# Patient Record
Sex: Female | Born: 1990
Health system: Southern US, Community
[De-identification: ages and names within clinical notes are randomized; demographics above are authoritative.]

## PROBLEM LIST (undated history)

## (undated) DIAGNOSIS — K601 Chronic anal fissure: Secondary | ICD-10-CM

## (undated) HISTORY — DX: Chronic anal fissure: K60.1

## (undated) HISTORY — PX: OTHER SURGICAL HISTORY: SHX169

## (undated) HISTORY — PX: WISDOM TOOTH EXTRACTION: SHX21

---

## 2014-02-19 HISTORY — PX: OTHER SURGICAL HISTORY: SHX169

## 2016-11-21 LAB — HM PAP SMEAR: HM Pap smear: NEGATIVE

## 2017-12-22 ENCOUNTER — Ambulatory Visit (INDEPENDENT_AMBULATORY_CARE_PROVIDER_SITE_OTHER): Payer: Self-pay | Admitting: Internal Medicine

## 2017-12-22 DIAGNOSIS — Z7189 Other specified counseling: Secondary | ICD-10-CM

## 2017-12-22 DIAGNOSIS — Z7184 Encounter for health counseling related to travel: Secondary | ICD-10-CM

## 2017-12-22 DIAGNOSIS — Z9189 Other specified personal risk factors, not elsewhere classified: Secondary | ICD-10-CM

## 2017-12-22 DIAGNOSIS — Z23 Encounter for immunization: Secondary | ICD-10-CM

## 2017-12-22 DIAGNOSIS — Z789 Other specified health status: Secondary | ICD-10-CM

## 2017-12-22 DIAGNOSIS — Z298 Encounter for other specified prophylactic measures: Secondary | ICD-10-CM

## 2017-12-22 MED ORDER — TYPHOID VACCINE PO CPDR: 1.0000 | DELAYED_RELEASE_CAPSULE | ORAL | 0 refills | Status: DC

## 2017-12-22 MED ORDER — DEXAMETHASONE 4 MG PO TABS: 4.0000 mg | ORAL_TABLET | Freq: Four times a day (QID) | ORAL | 0 refills | Status: DC

## 2017-12-22 MED ORDER — ACETAZOLAMIDE 125 MG PO TABS: 125.0000 mg | ORAL_TABLET | Freq: Two times a day (BID) | ORAL | 0 refills | Status: DC

## 2017-12-22 MED ORDER — AZITHROMYCIN 500 MG PO TABS: 500.0000 mg | ORAL_TABLET | Freq: Every day | ORAL | 0 refills | Status: DC

## 2017-12-22 NOTE — Progress Notes (Signed)
Subjective:   Virginia Haas is a 27 y.o. female who presents to the Infectious Disease clinic for travel consultation. Planned departure date: june          Planned return date: june Countries of travel: [place] Areas in country: urban and rural Accommodations: hotels Purpose of travel: vacation Prior travel out of [name]: yes    Objective:   Medications:reviewed    Assessment:   No contraindications to travel. none   Plan:   Altitude sickness prevention - will give diamox for prophylaxis and dexamethasone for treatment  Traveler's diarrhea - will give rx for azithromycin  Pre travel vaccines - she is uptodate on most except - will give rx for vivotif  For future trip to [place] Will likely need malaria proph, - will have her call back for malarone rx when she has dates of travel secured

## 2018-01-03 ENCOUNTER — Telehealth: Payer: Self-pay | Admitting: *Deleted

## 2018-01-03 DIAGNOSIS — Z298 Encounter for other specified prophylactic measures: Secondary | ICD-10-CM

## 2018-01-03 DIAGNOSIS — Z9189 Other specified personal risk factors, not elsewhere classified: Secondary | ICD-10-CM

## 2018-01-03 NOTE — Telephone Encounter (Signed)
Patient's husband called with 1) updated dates for her upcoming [place] trip (8/4-8/12) for malaria prescription and 2) to update her summer itinerary with 1 additional country, [place].  They will be in [place] with a 6 day safari to [place]. Please advise on malaria prescription for 1 or both trips, additional traveler's diarrhea prophylaxis, or if she should come in for another visit. Andree Coss, RN

## 2018-01-04 MED ORDER — ATOVAQUONE-PROGUANIL HCL 250-100 MG PO TABS: 1.0000 | ORAL_TABLET | Freq: Every day | ORAL | 0 refills | Status: DC

## 2018-01-04 NOTE — Telephone Encounter (Signed)
They will need malarone for both trips. To start 2 days prior to leaving, daily on full stomach, through 7 days after they return for [place]. For [place],  Start malarone 2 days prior to going to [place], take daily until complete (#16)

## 2018-01-04 NOTE — Addendum Note (Signed)
Addended by: Andree Coss on: 01/04/2018 05:19 PM   Modules accepted: Orders

## 2018-01-25 ENCOUNTER — Other Ambulatory Visit: Payer: Self-pay | Admitting: *Deleted

## 2018-01-25 DIAGNOSIS — Z298 Encounter for other specified prophylactic measures: Secondary | ICD-10-CM

## 2018-01-25 DIAGNOSIS — Z9189 Other specified personal risk factors, not elsewhere classified: Secondary | ICD-10-CM

## 2018-01-25 MED ORDER — ATOVAQUONE-PROGUANIL HCL 250-100 MG PO TABS: 1.0000 | ORAL_TABLET | Freq: Every day | ORAL | 0 refills | Status: DC

## 2018-07-06 ENCOUNTER — Ambulatory Visit: Payer: Self-pay | Admitting: Physician Assistant

## 2018-08-07 ENCOUNTER — Encounter: Payer: Self-pay | Admitting: Physician Assistant

## 2018-08-07 ENCOUNTER — Ambulatory Visit (INDEPENDENT_AMBULATORY_CARE_PROVIDER_SITE_OTHER): Payer: BC Managed Care – PPO | Admitting: Physician Assistant

## 2018-08-07 VITALS — BP 108/68 | HR 75 | Temp 98.9°F | Wt 145.0 lb

## 2018-08-07 DIAGNOSIS — Z789 Other specified health status: Secondary | ICD-10-CM | POA: Diagnosis not present

## 2018-08-07 NOTE — Progress Notes (Signed)
Patient: Virginia Haas, Female    DOB: 1990-12-14, 27 y.o.   MRN: [id number] Visit Date: 08/08/2018  Today's Provider: Trinna Post, PA-C   Chief Complaint  Patient presents with  . New Patient (Initial Visit)   Subjective:    Annual physical exam Virginia Haas is a 27 y.o. female who presents today for new patient appointment. She feels well. She reports exercising includes walking and yoga. She reports she is sleeping well. She is originally from the [place] area and moved here for her husband's work. She is currently living in [place] with her husband and has been in [place] since last October. She works as an Warden/ranger at [employer].   She receives her women's healthcare at [facility]. She reports she last had a PAP smear in 2018 at [facility] in [place], [place]. She reports it was normal. She is not currently on birth control and would like to have children. She is taking a prenatal vitamin. She is interested in knowing her immunity status to MMR and varicella before she gets pregnant.   -----------------------------------------------------------------   Review of Systems  Constitutional: Positive for fatigue.  HENT: Negative.   Eyes: Negative.   Respiratory: Negative.   Cardiovascular: Negative.   Gastrointestinal: Positive for abdominal pain and diarrhea.  Endocrine: Negative.   Genitourinary: Negative.   Musculoskeletal: Negative.   Skin: Negative.   Allergic/Immunologic: Negative.   Neurological: Negative.   Hematological: Negative.   Psychiatric/Behavioral: Negative.     Social History She  reports that she has never smoked. She has never used smokeless tobacco. She reports previous alcohol use. She reports previous drug use. Social History   Socioeconomic History  . Marital status: Married    Spouse name: Not on file  . Number of children: Not on file  . Years of education: Not on file  .  Highest education level: Not on file  Occupational History  . Not on file  Social Needs  . Financial resource strain: Not on file  . Food insecurity:    Worry: Not on file    Inability: Not on file  . Transportation needs:    Medical: Not on file    Non-medical: Not on file  Tobacco Use  . Smoking status: Never Smoker  . Smokeless tobacco: Never Used  Substance and Sexual Activity  . Alcohol use: Not Currently  . Drug use: Not Currently  . Sexual activity: Not on file  Lifestyle  . Physical activity:    Days per week: Not on file    Minutes per session: Not on file  . Stress: Not on file  Relationships  . Social connections:    Talks on phone: Not on file    Gets together: Not on file    Attends religious service: Not on file    Active member of club or organization: Not on file    Attends meetings of clubs or organizations: Not on file    Relationship status: Not on file  Other Topics Concern  . Not on file  Social History Narrative  . Not on file    There are no active problems to display for this patient.   Past Surgical History:  Procedure Laterality Date  . lateral internal sphincteromy      Family History  Family Status  Relation Name Status  . Father  (Not Specified)  . MGF  (Not Specified)  . PGF  (Not  Specified)   Her family history includes Cancer in her paternal grandfather; Crohn's disease in her maternal grandfather; Multiple sclerosis in her maternal grandfather; Parkinson's disease in her father.     No Known Allergies  Previous Medications   No medications on file    Patient Care Team: Patient, No Pcp Per as PCP - General (General Practice)      Objective:   Vitals: BP 108/68 (BP Location: Left Arm, Patient Position: Sitting, Cuff Size: Normal)   Pulse 75   Temp 98.9 F (37.2 C) (Oral)   Wt 145 lb (65.8 kg)   SpO2 97%    Physical Exam Constitutional:      Appearance: Normal appearance. She is normal weight.  HENT:     Right  Ear: Tympanic membrane and ear canal normal.     Left Ear: Tympanic membrane and ear canal normal.     Mouth/Throat:     Mouth: Mucous membranes are moist.     Pharynx: Oropharynx is clear.  Eyes:     Conjunctiva/sclera: Conjunctivae normal.  Neck:     Musculoskeletal: Neck supple.  Cardiovascular:     Rate and Rhythm: Normal rate and regular rhythm.  Pulmonary:     Effort: Pulmonary effort is normal.     Breath sounds: Normal breath sounds.  Neurological:     Mental Status: She is alert and oriented to person, place, and time. Mental status is at baseline.  Psychiatric:        Mood and Affect: Mood normal.        Behavior: Behavior normal.      Depression Screen PHQ 2/9 Scores 08/07/2018  PHQ - 2 Score 2  PHQ- 9 Score 7      Assessment & Plan:     Routine Health Maintenance and Physical Exam  Exercise Activities and Dietary recommendations Goals   None     Immunization History  Administered Date(s) Administered  . DTaP 04/09/1992, 06/08/1992, 08/03/1992, 08/31/1993, 03/10/1997  . HPV Quadrivalent 01/23/2006, 04/11/2006, 04/10/2007  . Hepatitis A 01/23/2006, 04/10/2007  . Hepatitis B [birth year], 1990, 03/09/1992, 08/03/1992  . IPV 04/09/1992, 06/08/1992, 08/31/1993, 03/10/1997  . Influenza-Unspecified 05/20/2014, 04/29/2015, 05/04/2016  . MMR 05/03/1993, 12/29/1997  . Meningococcal Conjugate 01/23/2006, 03/18/2010  . Tdap 01/23/2006, 04/22/2014  . Typhoid Live 12/22/2017  . Varicella 08/23/1993, 01/23/2006    Health Maintenance  Topic Date Due  . HIV Screening  02/03/2007  . PAP-Cervical Cytology Screening  02/02/2013  . PAP SMEAR-Modifier  02/02/2013  . INFLUENZA VACCINE  03/22/2018  . TETANUS/TDAP  04/22/2024     Discussed health benefits of physical activity, and encouraged her to engage in regular exercise appropriate for her age and condition.    1. Measles, mumps, rubella (MMR) vaccination status unknown  Requesting PAP records from previous  OBGYN. Counseled about prenatal vitamin, no safe level of alcohol.   - Measles/Mumps/Rubella Immunity  2. Varicella vaccination status unknown  - Varicella Zoster Abs, IgG/IgM  Return in about 1 year (around 08/08/2019).  The entirety of the information documented in the History of Present Illness, Review of Systems and Physical Exam were personally obtained by me. Portions of this information were initially documented by Hurman Horn, CMA and reviewed by me for thoroughness and accuracy.      --------------------------------------------------------------------

## 2018-08-08 ENCOUNTER — Encounter: Payer: Self-pay | Admitting: Physician Assistant

## 2018-08-08 LAB — MEASLES/MUMPS/RUBELLA IMMUNITY
MUMPS ABS, IGG: 275 AU/mL (ref >10.9)
RUBEOLA AB, IGG: >300 AU/mL (ref >16.4)
Rubella Antibodies, IGG: 2.28 index (ref >0.99)

## 2018-08-08 LAB — VARICELLA ZOSTER ABS, IGG/IGM
Varicella IgM: <0.91 index (ref 0.00–0.90)
Varicella zoster IgG: 388 index (ref >165)

## 2018-08-08 NOTE — Patient Instructions (Signed)
Measles (Rubeola) Antibody Testing  Why am I having this test?  Measles (rubeola) antibody testing checks for signs of the measles virus. Measles is a respiratory illness. A respiratory illness affects parts of the body that are involved in breathing, including the nose, throat, and lungs. Measles causes a rash and spreads through coughing and sneezing. You may have this test:   To diagnose measles, especially if:  ? You may have been exposed to the measles virus.  ? You got a rash after taking antibiotics.  ? Your health care provider cannot do a physical exam to diagnose you.   To check if you need the measles vaccine.   To make sure that you are protected from (immune to) the measles. You should be immune if you have had the measles before, or if you have had the measles vaccine. You may need to check your immunity if you are:  ? Pregnant.  ? A health care worker.  ? A college student.  What is being tested?  This test checks the blood for the presence of:   IgG (immunoglobulin G) antibodies.   IgM (immunoglobulin M) antibodies.  Antibodies are a type of cell that is part of the body's disease-fighting (immune) system. After you get an infection, your body makes antibodies that stay in your body after you recover and protect you from getting the same infection again.  What kind of sample is taken?    A blood sample is required for this test. It is usually collected by inserting a needle into a blood vessel. For babies, blood may be taken through the umbilical cord or through a needle prick in the back of the foot (heel stick).  Tell a health care provider about:   Any allergies you have.   All medicines you are taking, including vitamins, herbs, eye drops, creams, and over-the-counter medicines.   Any blood disorders you have.   Any surgeries you have had.   Any medical conditions you have.   Whether you are pregnant or may be pregnant.  How are the results reported?  Your test results will be reported  as either positive or negative. Positive means that you have the antibody, and negative means that you do not have the antibody.  What do the results mean?  A negative result for both antibodies is considered normal. This result means that you do not have measles and you are not immune to measles.  Positive for IgG antibodies means that you are immune to measles. You may have had measles in the past, or you may have had the measles vaccine.  Positive for IgM antibodies may mean that you have an active measles infection.  Talk with your health care provider about what your results mean.  Questions to ask your health care provider  Ask your health care provider, or the department that is doing the test:   When will my results be ready?   How will I get my results?   What are my treatment options?   What other tests do I need?   What are my next steps?  Summary   Measles (rubeola) antibody testing checks for signs of the measles virus. Measles is a respiratory illness that causes a rash and spreads through coughing and sneezing.   You may have this test to make sure that you are protected from (immune to) the measles.   Talk with your health care provider about what your results mean. A negative result for   both antibodies is considered normal. This result means that you do not have measles and you are not immune to measles.  This information is not intended to replace advice given to you by your health care provider. Make sure you discuss any questions you have with your health care provider.  Document Released: 01/30/2017 Document Revised: 01/30/2017 Document Reviewed: 01/30/2017  Elsevier Interactive Patient Education  2019 [employer]vier Inc[employer]

## 2018-08-09 ENCOUNTER — Telehealth: Payer: Self-pay

## 2018-08-09 NOTE — Telephone Encounter (Signed)
LMTCB

## 2018-08-09 NOTE — Telephone Encounter (Signed)
-----  Message from Trinna Post, [place] sent at 08/09/2018  9:31 AM EST ----- She is immune to MMR and varicella.

## 2018-08-09 NOTE — Telephone Encounter (Signed)
[facility] and patient verbalized understanding.

## 2018-08-27 ENCOUNTER — Emergency Department
Admission: EM | Admit: 2018-08-27 | Discharge: 2018-08-27 | Disposition: A | Discharge status: Home / Self Care | Payer: BLUE CROSS/BLUE SHIELD | Attending: Emergency Medicine | Admitting: Emergency Medicine

## 2018-08-27 ENCOUNTER — Emergency Department: Payer: BLUE CROSS/BLUE SHIELD

## 2018-08-27 ENCOUNTER — Encounter: Payer: Self-pay | Admitting: Emergency Medicine

## 2018-08-27 ENCOUNTER — Other Ambulatory Visit: Payer: Self-pay

## 2018-08-27 DIAGNOSIS — M79662 Pain in left lower leg: Secondary | ICD-10-CM | POA: Diagnosis not present

## 2018-08-27 DIAGNOSIS — M79661 Pain in right lower leg: Secondary | ICD-10-CM | POA: Diagnosis not present

## 2018-08-27 DIAGNOSIS — M79604 Pain in right leg: Secondary | ICD-10-CM | POA: Diagnosis not present

## 2018-08-27 DIAGNOSIS — R0600 Dyspnea, unspecified: Secondary | ICD-10-CM | POA: Insufficient documentation

## 2018-08-27 DIAGNOSIS — R918 Other nonspecific abnormal finding of lung field: Secondary | ICD-10-CM | POA: Diagnosis not present

## 2018-08-27 LAB — CBC WITH DIFFERENTIAL/PLATELET
Abs Immature Granulocytes: 0.01 10*3/uL (ref 0.00–0.07)
Basophils Absolute: 0.1 10*3/uL (ref 0.0–0.1)
Basophils Relative: 2 %
EOS PCT: 3 %
Eosinophils Absolute: 0.1 10*3/uL (ref 0.0–0.5)
HEMATOCRIT: 42.8 % (ref 36.0–46.0)
Hemoglobin: 13.8 g/dL (ref 12.0–15.0)
Immature Granulocytes: 0 %
Lymphocytes Relative: 35 %
Lymphs Abs: 1.8 10*3/uL (ref 0.7–4.0)
MCH: 30.1 pg (ref 26.0–34.0)
MCHC: 32.2 g/dL (ref 30.0–36.0)
MCV: 93.4 fL (ref 80.0–100.0)
Monocytes Absolute: 0.4 10*3/uL (ref 0.1–1.0)
Monocytes Relative: 8 %
Neutro Abs: 2.7 10*3/uL (ref 1.7–7.7)
Neutrophils Relative %: 52 %
Platelets: 219 10*3/uL (ref 150–400)
RBC: 4.58 MIL/uL (ref 3.87–5.11)
RDW: 12.6 % (ref 11.5–15.5)
WBC: 5.1 10*3/uL (ref 4.0–10.5)
nRBC: 0 % (ref 0.0–0.2)

## 2018-08-27 LAB — POCT PREGNANCY, URINE: Preg Test, Ur: NEGATIVE

## 2018-08-27 LAB — BASIC METABOLIC PANEL
Anion gap: 6 (ref 5–15)
BUN: 7 mg/dL (ref 6–20)
CO2: 25 mmol/L (ref 22–32)
Calcium: 9.2 mg/dL (ref 8.9–10.3)
Chloride: 107 mmol/L (ref 98–111)
Creatinine, Ser: 0.63 mg/dL (ref 0.44–1.00)
GFR calc Af Amer: >60 mL/min (ref >60)
GFR calc non Af Amer: >60 mL/min (ref >60)
Glucose, Bld: 97 mg/dL (ref 70–99)
Potassium: 3.9 mmol/L (ref 3.5–5.1)
Sodium: 138 mmol/L (ref 135–145)

## 2018-08-27 LAB — FIBRIN DERIVATIVES D-DIMER (ARMC ONLY): Fibrin derivatives D-dimer (ARMC): 267.97 ng/mL (FEU) (ref 0.00–499.00)

## 2018-08-27 NOTE — ED Triage Notes (Signed)
Pt c/o R calf pain. Pt states sudden onset pain this morning while in the shower. Pt states no known injury at this time.

## 2018-08-27 NOTE — ED Provider Notes (Signed)
ED ECG REPORT I, Arelia Longest, the attending physician, personally viewed and interpreted this ECG.   Date: 08/27/2018  EKG Time: 1227  Rate: 76  Rhythm: normal sinus rhythm  Axis: Normal  Intervals:Complete right bundle branch block  ST&T Change: No ST segment elevation or depression.  T wave inversions in V2 and V3. No previous for comparison.   Myrna Blazer, MD 08/27/18 [phone]

## 2018-08-27 NOTE — Discharge Instructions (Addendum)
Your labs and ultrasound were negative for DVT.  Due to vague findings on x-ray consistent ing of an Indistinct right heart border advised evaluation by cardiologist.  Call today and tell him to follow-up in the emergency room so they can schedule routine appointment.  Return to ED if condition worsens.

## 2018-08-27 NOTE — ED Notes (Signed)
Pt ambulated to the bathroom to void.   

## 2018-08-27 NOTE — ED Provider Notes (Signed)
[facility] Emergency Department Provider Note   ____________________________________________   First MD Initiated Contact with Patient 08/27/18 [phone]     (approximate)  I have reviewed the triage vital signs and the nursing notes.   HISTORY  Chief Complaint Leg Pain    HPI Virginia Haas is a 28 y.o. female patient complain of bilateral calf pain right greater than left.  Patient the right calf pain increased with sudden onset in the shower this morning.  Patient states leg pain preceded a couple days by intermittent dyspnea.  Patient reveals recent history [place].  Patient denies chest pain.  Patient currently rates her pain as a 4/10.  Patient described the pain is "achy" in the right calf.  Patient recently discontinued IUD.   Past Medical History:  Diagnosis Date  . Chronic anal fissure     There are no active problems to display for this patient.   Past Surgical History:  Procedure Laterality Date  . lateral internal sphincteromy      Prior to Admission medications   Not on File    Allergies Patient has no known allergies.  Family History  Problem Relation Age of Onset  . Parkinson's disease Father   . Crohn's disease Maternal Grandfather   . Multiple sclerosis Maternal Grandfather   . Cancer Paternal Grandfather     Social History Social History   Tobacco Use  . Smoking status: Never Smoker  . Smokeless tobacco: Never Used  Substance Use Topics  . Alcohol use: Not Currently  . Drug use: Not Currently    Review of Systems Constitutional: No fever/chills Eyes: No visual changes. ENT: No sore throat. Cardiovascular: Denies chest pain. Respiratory: Denies shortness of breath. Gastrointestinal: No abdominal pain.  No nausea, no vomiting.  No diarrhea.  No constipation. Genitourinary: Negative for dysuria. Musculoskeletal: Negative for back pain. Skin: Negative for rash. Neurological: Negative  for headaches, focal weakness or numbness.   ____________________________________________   PHYSICAL EXAM:  VITAL SIGNS: ED Triage Vitals  Enc Vitals Group     BP 08/27/18 0913 128/80     Pulse Rate 08/27/18 0913 91     Resp 08/27/18 0913 18     Temp 08/27/18 0913 98.4 F (36.9 C)     Temp Source 08/27/18 0913 Oral     SpO2 08/27/18 0913 100 %     Weight 08/27/18 0901 145 lb (65.8 kg)     Height 08/27/18 0901 5\' 6"  (1.676 m)     Head Circumference --      Peak Flow --      Pain Score 08/27/18 0901 4     Pain Loc --      Pain Edu? --      Excl. in GC? --     Constitutional: Alert and oriented. Well appearing and in no acute distress. Hematological/Lymphatic/Immunilogical: No cervical lymphadenopathy. Cardiovascular: Normal rate, regular rhythm. Grossly normal heart sounds.  Good peripheral circulation. Respiratory: Normal respiratory effort.  No retractions. Lungs CTAB. Gastrointestinal: Soft and nontender. No distention. No abdominal bruits. No CVA tenderness. Genitourinary: Deferred Musculoskeletal: Bilateral guarding of calfs.  No joint effusions. Neurologic:  Normal speech and language. No gross focal neurologic deficits are appreciated. No gait instability. Skin:  Skin is warm, dry and intact. No rash noted. Psychiatric: Mood and affect are normal. Speech and behavior are normal.  ____________________________________________   LABS (all labs ordered are listed, but only abnormal results are displayed)  Labs Reviewed  FIBRIN DERIVATIVES D-DIMER ([facility] ONLY)  CBC WITH DIFFERENTIAL/PLATELET  BASIC METABOLIC PANEL  POC URINE PREG, ED  POCT PREGNANCY, URINE   ____________________________________________  EKG EKG read by heart station Dr. with no acute findings.  ____________________________________________  RADIOLOGY  ED MD interpretation:    Official radiology report(s): Dg Chest 2 View  Result Date: 08/27/2018 CLINICAL DATA:  Right lower extremity pain  beginning today. EXAM: CHEST - 2 VIEW COMPARISON:  None. FINDINGS: Heart size is normal. Mediastinal shadows are normal. Indistinct right heart margin felt secondary to a narrow AP diameter chest and pericardial fat. No suspicion of infiltrate, collapse or effusion. No abnormal bone finding. IMPRESSION: No active disease suspected. Indistinct right heart border felt secondary to narrow AP diameter chest and pericardial fat. Electronically Signed   By: Paulina Fusi M.D.   On: 08/27/2018 11:44   US Venous Img Lower Bilateral  Result Date: 08/27/2018 CLINICAL DATA:  28 year old female with chest pain EXAM: BILATERAL LOWER EXTREMITY VENOUS DOPPLER ULTRASOUND TECHNIQUE: Gray-scale sonography with graded compression, as well as color Doppler and duplex ultrasound were performed to evaluate the lower extremity deep venous systems from the level of the common femoral vein and including the common femoral, femoral, profunda femoral, popliteal and calf veins including the posterior tibial, peroneal and gastrocnemius veins when visible. The superficial great saphenous vein was also interrogated. Spectral Doppler was utilized to evaluate flow at rest and with distal augmentation maneuvers in the common femoral, femoral and popliteal veins. COMPARISON:  None. FINDINGS: RIGHT LOWER EXTREMITY Common Femoral Vein: No evidence of thrombus. Normal compressibility, respiratory phasicity and response to augmentation. Saphenofemoral Junction: No evidence of thrombus. Normal compressibility and flow on color Doppler imaging. Profunda Femoral Vein: No evidence of thrombus. Normal compressibility and flow on color Doppler imaging. Femoral Vein: No evidence of thrombus. Normal compressibility, respiratory phasicity and response to augmentation. Popliteal Vein: No evidence of thrombus. Normal compressibility, respiratory phasicity and response to augmentation. Calf Veins: No evidence of thrombus. Normal compressibility and flow on color  Doppler imaging. Superficial Great Saphenous Vein: No evidence of thrombus. Normal compressibility and flow on color Doppler imaging Other Findings:  None. LEFT LOWER EXTREMITY Common Femoral Vein: No evidence of thrombus. Normal compressibility, respiratory phasicity and response to augmentation. Saphenofemoral Junction: No evidence of thrombus. Normal compressibility and flow on color Doppler imaging. Profunda Femoral Vein: No evidence of thrombus. Normal compressibility and flow on color Doppler imaging. Femoral Vein: No evidence of thrombus. Normal compressibility, respiratory phasicity and response to augmentation. Popliteal Vein: No evidence of thrombus. Normal compressibility, respiratory phasicity and response to augmentation. Calf Veins: No evidence of thrombus. Normal compressibility and flow on color Doppler imaging. Superficial Great Saphenous Vein: No evidence of thrombus. Normal compressibility and flow on color Doppler imaging. Other Findings:  None. IMPRESSION: Sonographic survey of the bilateral lower extremities negative for DVT. Electronically Signed   By: Gilmer Mor D.O.   On: 08/27/2018 11:36    ____________________________________________   PROCEDURES  Procedure(s) performed: None  Procedures  Critical Care performed: No  ____________________________________________   INITIAL IMPRESSION / ASSESSMENT AND PLAN / ED COURSE  As part of my medical decision making, I reviewed the following data within the electronic MEDICAL RECORD NUMBER   Patient presents with bilateral calf pain and intimating dysphagia.  Differential consist of DVT, muscle skeletal pain, and anxiety.  Discussed l labs and ultrasound results with patient.  Incidental finding of indistinct narrowing of the right heart border on chest x-ray.  Patient states  there was some pediatric heart condition which was deemed nonemergent.  Patient has not had a follow-up with cardiology since diagnosis.  Patient given  discharge care instruction advised to contact cardiology to schedule routine appointment.      ____________________________________________   FINAL CLINICAL IMPRESSION(S) / ED DIAGNOSES  Final diagnoses:  Bilateral calf pain  Dyspnea, paroxysmal nocturnal     ED Discharge Orders    None       Note:  This document was prepared using Dragon voice recognition software and may include unintentional dictation errors.    Joni Reining, PA-C 08/27/18 1243    Sharman Cheek, MD 09/03/18 [phone]

## 2018-08-27 NOTE — ED Notes (Addendum)
See triage note    States she developed some pain to right lower leg this am  Pain is mainly to lateral leg ,below the knee    Also states she had a recent flight

## 2019-01-02 ENCOUNTER — Other Ambulatory Visit: Payer: Self-pay

## 2019-01-02 ENCOUNTER — Ambulatory Visit (INDEPENDENT_AMBULATORY_CARE_PROVIDER_SITE_OTHER): Payer: BLUE CROSS/BLUE SHIELD | Admitting: Physician Assistant

## 2019-01-02 ENCOUNTER — Encounter: Payer: Self-pay | Admitting: Physician Assistant

## 2019-01-02 VITALS — BP 112/81 | HR 87 | Temp 98.8°F | Resp 16 | Wt 134.6 lb

## 2019-01-02 DIAGNOSIS — Z920 Personal history of contraception: Secondary | ICD-10-CM | POA: Diagnosis not present

## 2019-01-02 DIAGNOSIS — N926 Irregular menstruation, unspecified: Secondary | ICD-10-CM | POA: Diagnosis not present

## 2019-01-02 DIAGNOSIS — R5383 Other fatigue: Secondary | ICD-10-CM

## 2019-01-02 NOTE — Progress Notes (Signed)
       Patient: Virginia Haas Female    DOB: 16-Dec-1990   28 y.o.   MRN: [id number] Visit Date: 01/02/2019  Today's Provider: Trey Sailors, PA-C   Chief Complaint  Patient presents with  . Menstrual Problem   Subjective:     HPI     Patient comes in office today to address concerns of irregular menses. Patient states that since she has had IUD removed on 07/27/18 she has had irregular menses that comes every 18 days last 5 days, patient describes menstrual flow as light and denies symptoms of abdominal pain of blood clots being seen. Patient states that she is currently active and until march had actively been trying to get pregnant but has since put plans on hold and has been using condoms for contraception management. Patient does not want to start OCP. Concerned about potential thyroid issues.    No Known Allergies  No current outpatient medications on file.  Review of Systems  Constitutional: Positive for fatigue.  HENT: Negative.   Cardiovascular: Negative.   Endocrine: Negative.   Genitourinary: Positive for menstrual problem.  Hematological: Negative.   Psychiatric/Behavioral: Negative.     Social History   Tobacco Use  . Smoking status: Never Smoker  . Smokeless tobacco: Never Used  Substance Use Topics  . Alcohol use: Not Currently      Objective:   There were no vitals taken for this visit. There were no vitals filed for this visit.   Physical Exam Constitutional:      Appearance: Normal appearance.  Neck:     Musculoskeletal: Neck supple.     Thyroid: No thyroid mass, thyromegaly or thyroid tenderness.  Cardiovascular:     Rate and Rhythm: Normal rate and regular rhythm.     Heart sounds: Normal heart sounds.  Pulmonary:     Effort: Pulmonary effort is normal.     Breath sounds: Normal breath sounds.  Skin:    General: Skin is warm and dry.  Neurological:     Mental Status: She is alert and oriented to person, place, and time. Mental  status is at baseline.  Psychiatric:        Mood and Affect: Mood normal.        Behavior: Behavior normal.         Assessment & Plan    1. Irregular periods  Have counseled patient that this may be due to removal of IUD and take some time to resolve. Have counseled about other secondary causes of irregular menses. Patient does not with to start OCP at this time. Labs as below.  2. History of use of contraceptive intrauterine device (IUD)   3. Other fatigue  - CBC with Differential - TSH - Comprehensive Metabolic Panel (CMET) - Fe+TIBC+Fer  I have spent 25 minutes with this patient, >50% of which was spent on counseling and coordination of care.  The entirety of the information documented in the History of Present Illness, Review of Systems and Physical Exam were personally obtained by me. Portions of this information were initially documented by Sheliah Hatch, CMA and reviewed by me for thoroughness and accuracy.   F/u PRN      Trey Sailors, PA-C  [facility]

## 2019-01-02 NOTE — Patient Instructions (Signed)
Hypothyroidism  Hypothyroidism is when the thyroid gland does not make enough of certain hormones (it is underactive). The thyroid gland is a small gland located in the lower front part of the neck, just in front of the windpipe (trachea). This gland makes hormones that help control how the body uses food for energy (metabolism) as well as how the heart and brain function. These hormones also play a role in keeping your bones strong. When the thyroid is underactive, it produces too little of the hormones thyroxine (T4) and triiodothyronine (T3). What are the causes? This condition may be caused by:  Hashimoto's disease. This is a disease in which the body's disease-fighting system (immune system) attacks the thyroid gland. This is the most common cause.  Viral infections.  Pregnancy.  Certain medicines.  Birth defects.  Past radiation treatments to the head or neck for cancer.  Past treatment with radioactive iodine.  Past exposure to radiation in the environment.  Past surgical removal of part or all of the thyroid.  Problems with a gland in the center of the brain (pituitary gland).  Lack of enough iodine in the diet. What increases the risk? You are more likely to develop this condition if:  You are female.  You have a family history of thyroid conditions.  You use a medicine called lithium.  You take medicines that affect the immune system (immunosuppressants). What are the signs or symptoms? Symptoms of this condition include:  Feeling as though you have no energy (lethargy).  Not being able to tolerate cold.  Weight gain that is not explained by a change in diet or exercise habits.  Lack of appetite.  Dry skin.  Coarse hair.  Menstrual irregularity.  Slowing of thought processes.  Constipation.  Sadness or depression. How is this diagnosed? This condition may be diagnosed based on:  Your symptoms, your medical history, and a physical exam.  Blood  tests. You may also have imaging tests, such as an ultrasound or MRI. How is this treated? This condition is treated with medicine that replaces the thyroid hormones that your body does not make. After you begin treatment, it may take several weeks for symptoms to go away. Follow these instructions at home:  Take over-the-counter and prescription medicines only as told by your health care provider.  If you start taking any new medicines, tell your health care provider.  Keep all follow-up visits as told by your health care provider. This is important. ? As your condition improves, your dosage of thyroid hormone medicine may change. ? You will need to have blood tests regularly so that your health care provider can monitor your condition. Contact a health care provider if:  Your symptoms do not get better with treatment.  You are taking thyroid replacement medicine and you: ? Sweat a lot. ? Have tremors. ? Feel anxious. ? Lose weight rapidly. ? Cannot tolerate heat. ? Have emotional swings. ? Have diarrhea. ? Feel weak. Get help right away if you have:  Chest pain.  An irregular heartbeat.  A rapid heartbeat.  Difficulty breathing. Summary  Hypothyroidism is when the thyroid gland does not make enough of certain hormones (it is underactive).  When the thyroid is underactive, it produces too little of the hormones thyroxine (T4) and triiodothyronine (T3).  The most common cause is Hashimoto's disease, a disease in which the body's disease-fighting system (immune system) attacks the thyroid gland. The condition can also be caused by viral infections, medicine, pregnancy, or past   radiation treatment to the head or neck.  Symptoms may include weight gain, dry skin, constipation, feeling as though you do not have energy, and not being able to tolerate cold.  This condition is treated with medicine to replace the thyroid hormones that your body does not make. This information  is not intended to replace advice given to you by your health care provider. Make sure you discuss any questions you have with your health care provider. Document Released: 08/08/2005 Document Revised: 07/19/2017 Document Reviewed: 07/19/2017 Elsevier Interactive Patient Education  2019 [employer]vier Inc[employer]  

## 2019-01-21 DIAGNOSIS — Z3201 Encounter for pregnancy test, result positive: Secondary | ICD-10-CM | POA: Diagnosis not present

## 2019-01-29 DIAGNOSIS — Z3A01 Less than 8 weeks gestation of pregnancy: Secondary | ICD-10-CM | POA: Diagnosis not present

## 2019-01-29 DIAGNOSIS — O3680X0 Pregnancy with inconclusive fetal viability, not applicable or unspecified: Secondary | ICD-10-CM | POA: Diagnosis not present

## 2019-02-12 DIAGNOSIS — Z3201 Encounter for pregnancy test, result positive: Secondary | ICD-10-CM | POA: Diagnosis not present

## 2019-02-12 DIAGNOSIS — N912 Amenorrhea, unspecified: Secondary | ICD-10-CM | POA: Diagnosis not present

## 2019-02-12 DIAGNOSIS — Z3A01 Less than 8 weeks gestation of pregnancy: Secondary | ICD-10-CM | POA: Diagnosis not present

## 2019-02-12 DIAGNOSIS — O3680X0 Pregnancy with inconclusive fetal viability, not applicable or unspecified: Secondary | ICD-10-CM | POA: Diagnosis not present

## 2019-02-25 DIAGNOSIS — O3680X0 Pregnancy with inconclusive fetal viability, not applicable or unspecified: Secondary | ICD-10-CM | POA: Diagnosis not present

## 2019-02-25 DIAGNOSIS — Z368A Encounter for antenatal screening for other genetic defects: Secondary | ICD-10-CM | POA: Diagnosis not present

## 2019-02-25 DIAGNOSIS — Z113 Encounter for screening for infections with a predominantly sexual mode of transmission: Secondary | ICD-10-CM | POA: Diagnosis not present

## 2019-02-25 DIAGNOSIS — Z3689 Encounter for other specified antenatal screening: Secondary | ICD-10-CM | POA: Diagnosis not present

## 2019-02-25 LAB — OB RESULTS CONSOLE ABO/RH: RH Type: POSITIVE

## 2019-02-25 LAB — OB RESULTS CONSOLE GC/CHLAMYDIA
Chlamydia: NEGATIVE
Gonorrhea: NEGATIVE

## 2019-02-25 LAB — OB RESULTS CONSOLE RUBELLA ANTIBODY, IGM: Rubella: IMMUNE

## 2019-02-25 LAB — OB RESULTS CONSOLE HEPATITIS B SURFACE ANTIGEN: Hepatitis B Surface Ag: NEGATIVE

## 2019-02-25 LAB — OB RESULTS CONSOLE HIV ANTIBODY (ROUTINE TESTING): HIV: NONREACTIVE

## 2019-02-25 LAB — OB RESULTS CONSOLE RPR: RPR: NONREACTIVE

## 2019-02-25 LAB — OB RESULTS CONSOLE ANTIBODY SCREEN: Antibody Screen: NEGATIVE

## 2019-03-19 DIAGNOSIS — O09512 Supervision of elderly primigravida, second trimester: Secondary | ICD-10-CM | POA: Diagnosis not present

## 2019-03-19 DIAGNOSIS — Z3A12 12 weeks gestation of pregnancy: Secondary | ICD-10-CM | POA: Diagnosis not present

## 2019-03-19 DIAGNOSIS — O09521 Supervision of elderly multigravida, first trimester: Secondary | ICD-10-CM | POA: Diagnosis not present

## 2019-03-19 DIAGNOSIS — Z3682 Encounter for antenatal screening for nuchal translucency: Secondary | ICD-10-CM | POA: Diagnosis not present

## 2019-03-19 DIAGNOSIS — O09511 Supervision of elderly primigravida, first trimester: Secondary | ICD-10-CM | POA: Diagnosis not present

## 2019-03-19 DIAGNOSIS — O09522 Supervision of elderly multigravida, second trimester: Secondary | ICD-10-CM | POA: Diagnosis not present

## 2019-04-23 LAB — LIPID PANEL
Cholesterol: 232 — AB (ref 0–200)
HDL: 95 — AB (ref 35–70)
LDL Cholesterol: 102
Triglycerides: 212 — AB (ref 40–160)

## 2019-04-23 LAB — BASIC METABOLIC PANEL: Glucose: 80

## 2019-05-03 DIAGNOSIS — Z363 Encounter for antenatal screening for malformations: Secondary | ICD-10-CM | POA: Diagnosis not present

## 2019-05-03 DIAGNOSIS — Z3A19 19 weeks gestation of pregnancy: Secondary | ICD-10-CM | POA: Diagnosis not present

## 2019-05-14 DIAGNOSIS — N898 Other specified noninflammatory disorders of vagina: Secondary | ICD-10-CM | POA: Diagnosis not present

## 2019-06-19 DIAGNOSIS — Z3A25 25 weeks gestation of pregnancy: Secondary | ICD-10-CM | POA: Diagnosis not present

## 2019-06-19 DIAGNOSIS — Z23 Encounter for immunization: Secondary | ICD-10-CM | POA: Diagnosis not present

## 2019-06-26 DIAGNOSIS — Z3A26 26 weeks gestation of pregnancy: Secondary | ICD-10-CM | POA: Diagnosis not present

## 2019-06-26 DIAGNOSIS — R309 Painful micturition, unspecified: Secondary | ICD-10-CM | POA: Diagnosis not present

## 2019-07-03 DIAGNOSIS — Z3689 Encounter for other specified antenatal screening: Secondary | ICD-10-CM | POA: Diagnosis not present

## 2019-07-03 DIAGNOSIS — Z23 Encounter for immunization: Secondary | ICD-10-CM | POA: Diagnosis not present

## 2019-07-03 DIAGNOSIS — Z3A27 27 weeks gestation of pregnancy: Secondary | ICD-10-CM | POA: Diagnosis not present

## 2019-08-11 ENCOUNTER — Encounter (HOSPITAL_COMMUNITY): Payer: Self-pay | Admitting: Obstetrics and Gynecology

## 2019-08-11 ENCOUNTER — Inpatient Hospital Stay (HOSPITAL_COMMUNITY): Payer: BC Managed Care – PPO

## 2019-08-11 ENCOUNTER — Other Ambulatory Visit: Payer: Self-pay

## 2019-08-11 ENCOUNTER — Inpatient Hospital Stay (HOSPITAL_COMMUNITY)
Admission: AD | Admit: 2019-08-11 | Discharge: 2019-08-12 | Disposition: A | Discharge status: Home / Self Care | Payer: BC Managed Care – PPO | Source: Ambulatory Visit | Attending: Obstetrics and Gynecology | Admitting: Obstetrics and Gynecology

## 2019-08-11 DIAGNOSIS — Z820 Family history of epilepsy and other diseases of the nervous system: Secondary | ICD-10-CM | POA: Diagnosis not present

## 2019-08-11 DIAGNOSIS — O26893 Other specified pregnancy related conditions, third trimester: Secondary | ICD-10-CM | POA: Insufficient documentation

## 2019-08-11 DIAGNOSIS — Z3A33 33 weeks gestation of pregnancy: Secondary | ICD-10-CM | POA: Diagnosis not present

## 2019-08-11 DIAGNOSIS — H539 Unspecified visual disturbance: Secondary | ICD-10-CM

## 2019-08-11 LAB — COMPREHENSIVE METABOLIC PANEL
ALT: 12 U/L (ref 0–44)
AST: 14 U/L — ABNORMAL LOW (ref 15–41)
Albumin: 2.5 g/dL — ABNORMAL LOW (ref 3.5–5.0)
Alkaline Phosphatase: 76 U/L (ref 38–126)
Anion gap: 7 (ref 5–15)
BUN: <5 mg/dL — ABNORMAL LOW (ref 6–20)
CO2: 22 mmol/L (ref 22–32)
Calcium: 8.7 mg/dL — ABNORMAL LOW (ref 8.9–10.3)
Chloride: 107 mmol/L (ref 98–111)
Creatinine, Ser: 0.5 mg/dL (ref 0.44–1.00)
GFR calc Af Amer: >60 mL/min (ref >60)
GFR calc non Af Amer: >60 mL/min (ref >60)
Glucose, Bld: 95 mg/dL (ref 70–99)
Potassium: 3.4 mmol/L — ABNORMAL LOW (ref 3.5–5.1)
Sodium: 136 mmol/L (ref 135–145)
Total Bilirubin: 0.3 mg/dL (ref 0.3–1.2)
Total Protein: 5.6 g/dL — ABNORMAL LOW (ref 6.5–8.1)

## 2019-08-11 LAB — URINALYSIS, ROUTINE W REFLEX MICROSCOPIC
Bilirubin Urine: NEGATIVE
Glucose, UA: NEGATIVE mg/dL
Hgb urine dipstick: NEGATIVE
Ketones, ur: NEGATIVE mg/dL
Nitrite: NEGATIVE
Protein, ur: NEGATIVE mg/dL
Specific Gravity, Urine: 1.004 — ABNORMAL LOW (ref 1.005–1.030)
pH: 7 (ref 5.0–8.0)

## 2019-08-11 LAB — CBC
HCT: 33.6 % — ABNORMAL LOW (ref 36.0–46.0)
Hemoglobin: 11.2 g/dL — ABNORMAL LOW (ref 12.0–15.0)
MCH: 31.9 pg (ref 26.0–34.0)
MCHC: 33.3 g/dL (ref 30.0–36.0)
MCV: 95.7 fL (ref 80.0–100.0)
Platelets: 181 10*3/uL (ref 150–400)
RBC: 3.51 MIL/uL — ABNORMAL LOW (ref 3.87–5.11)
RDW: 13 % (ref 11.5–15.5)
WBC: 10 10*3/uL (ref 4.0–10.5)
nRBC: 0 % (ref 0.0–0.2)

## 2019-08-11 NOTE — MAU Provider Note (Addendum)
Chief Complaint:  VISION CHANGES   First Provider Initiated Contact with Patient 08/11/19 1804      HPI: Virginia Haas is a 28 y.o. G1P0 at [redacted]w[redacted]d who presents to maternity admissions reporting an episode of vision changes tonight before coming to MAU.  She reports she woke up from a nap and had a black spot obscuring part of her vision in her left eye only. This lasted for about 30 minutes then resolved spontaneously. There was no pain. Following the spot in her vision, there were light sparkles or floaters for a few more minutes that have also resolved.  She reports she has eaten regularly and had water today, but not as much as usual.  There was mild nausea before the vision changes occurred but none now. There are no other symptoms. She has not tried any treatments.    HPI  Past Medical History: Past Medical History:  Diagnosis Date  . Chronic anal fissure     Past obstetric history: OB History  Gravida Para Term Preterm AB Living  1            SAB TAB Ectopic Multiple Live Births               # Outcome Date GA Lbr Len/2nd Weight Sex Delivery Anes PTL Lv  1 Current             Past Surgical History: Past Surgical History:  Procedure Laterality Date  . lateral internal sphincteromy      Family History: Family History  Problem Relation Age of Onset  . Parkinson's disease Father   . Crohn's disease Maternal Grandfather   . Multiple sclerosis Maternal Grandfather   . Cancer Paternal Grandfather     Social History: Social History   Tobacco Use  . Smoking status: Never Smoker  . Smokeless tobacco: Never Used  Substance Use Topics  . Alcohol use: Not Currently  . Drug use: Not Currently    Allergies: No Known Allergies  Meds:  No medications prior to admission.    ROS:  Review of Systems  Constitutional: Negative for chills, fatigue and fever.  Eyes: Positive for visual disturbance.  Respiratory: Negative for shortness of breath.   Cardiovascular:  Negative for chest pain.  Gastrointestinal: Positive for nausea. Negative for abdominal pain and vomiting.  Genitourinary: Negative for difficulty urinating, dysuria, flank pain, pelvic pain, vaginal bleeding, vaginal discharge and vaginal pain.  Neurological: Negative for dizziness and headaches.  Psychiatric/Behavioral: Negative.      I have reviewed patient's Past Medical Hx, Surgical Hx, Family Hx, Social Hx, medications and allergies.   Physical Exam   Patient Vitals for the past 24 hrs:  BP Temp Temp src Pulse Resp SpO2 Weight  08/11/19 1800 96/66 - - 92 - - -  08/11/19 1745 109/72 - - 89 - - -  08/11/19 1719 118/79 97.7 F (36.5 C) Oral (!) 114 17 98 % 81.9 kg   Constitutional: Well-developed, well-nourished female in no acute distress.  Cardiovascular: normal rate Respiratory: normal effort GI: Abd soft, non-tender, gravid appropriate for gestational age.  MS: Extremities nontender, no edema, normal ROM Neurological - alert, oriented, normal speech, no focal findings or movement disorder noted, screening mental status exam normal, cranial nerves II through XII intact, DTR's normal and symmetric, motor and sensory grossly normal bilaterally, normal muscle tone, no tremors, strength 5/5 GU: Neg CVAT.     FHT:  Baseline 125 , moderate variability, accelerations present, no decelerations Contractions: None  on toco or to palpation   Labs: Results for orders placed or performed during the hospital encounter of 08/11/19 (from the past 24 hour(s))  Urinalysis, Routine w reflex microscopic     Status: Abnormal   Collection Time: 08/11/19  5:06 PM  Result Value Ref Range   Color, Urine STRAW (A) YELLOW   APPearance CLEAR CLEAR   Specific Gravity, Urine 1.004 (L) 1.005 - 1.030   pH 7.0 5.0 - 8.0   Glucose, UA NEGATIVE NEGATIVE mg/dL   Hgb urine dipstick NEGATIVE NEGATIVE   Bilirubin Urine NEGATIVE NEGATIVE   Ketones, ur NEGATIVE NEGATIVE mg/dL   Protein, ur NEGATIVE NEGATIVE  mg/dL   Nitrite NEGATIVE NEGATIVE   Leukocytes,Ua TRACE (A) NEGATIVE   WBC, UA 0-5 0 - 5 WBC/hpf   Bacteria, UA MANY (A) NONE SEEN   Squamous Epithelial / LPF 0-5 0 - 5  CBC     Status: Abnormal   Collection Time: 08/11/19  7:42 PM  Result Value Ref Range   WBC 10.0 4.0 - 10.5 K/uL   RBC 3.51 (L) 3.87 - 5.11 MIL/uL   Hemoglobin 11.2 (L) 12.0 - 15.0 g/dL   HCT 33.6 (L) 36.0 - 46.0 %   MCV 95.7 80.0 - 100.0 fL   MCH 31.9 26.0 - 34.0 pg   MCHC 33.3 30.0 - 36.0 g/dL   RDW 13.0 11.5 - 15.5 %   Platelets 181 150 - 400 K/uL   nRBC 0.0 0.0 - 0.2 %  Comprehensive metabolic panel     Status: Abnormal   Collection Time: 08/11/19  7:42 PM  Result Value Ref Range   Sodium 136 135 - 145 mmol/L   Potassium 3.4 (L) 3.5 - 5.1 mmol/L   Chloride 107 98 - 111 mmol/L   CO2 22 22 - 32 mmol/L   Glucose, Bld 95 70 - 99 mg/dL   BUN <5 (L) 6 - 20 mg/dL   Creatinine, Ser 0.50 0.44 - 1.00 mg/dL   Calcium 8.7 (L) 8.9 - 10.3 mg/dL   Total Protein 5.6 (L) 6.5 - 8.1 g/dL   Albumin 2.5 (L) 3.5 - 5.0 g/dL   AST 14 (L) 15 - 41 U/L   ALT 12 0 - 44 U/L   Alkaline Phosphatase 76 38 - 126 U/L   Total Bilirubin 0.3 0.3 - 1.2 mg/dL   GFR calc non Af Amer >60 >60 mL/min   GFR calc Af Amer >60 >60 mL/min   Anion gap 7 5 - 15      Imaging:  No results found.  MAU Course/MDM: Orders Placed This Encounter  Procedures  . Urinalysis, Routine w reflex microscopic  . CBC  . Comprehensive metabolic panel    No orders of the defined types were placed in this encounter.    NST reviewed and reactive Neuro exam wnl in MAU, symptoms resolved prior to arrival in MAU CBC results wnl  Report to Dr Dione Plover with CMP pending    Fatima Blank Certified Nurse-Midwife 08/11/2019 8:45 PM    Received signout as above. Repeated hx with patient: around 3:35 PM woke up with loss of superior and inferior borders of vision in left eye described as a "blackness". She maintained consciousness throughout, was able  to get up and move around her apartment and speak with her partner without issue during this time. Called OB who instructed her to come to MAU, by time of arrival blackness had gone but she continued to have occasional flashes of light in  her vision. During episode did close each eye and confirmed was only happening in her left eye. Denies any headache during or after the episode. Currently denies any numbness, tingling, or weakness in any part of her body, no difficulty with speech or word finding. Reports hx of MS in maternal grandfather and her father w MS. Denies any personal PMHx of importance.  On exam patient with normal cranial nerves including visual fields, strength 5/5 and sensation intact to light touch in bilateral UE/LE, normal finger to nose, normal shin-heel, reflexes 2+ in bilateral UE/LE. No opthalmoscope available for retinal exam.  Discussed with on call Neurologist Dr. Wilford CornerArora, recommended MRI brain to see if further workup necessary.  MRI obtained which was normal. DDx incorporating MRI makes MS or ischemic event unlikely. Per discussion w Neuro symptoms most c/w migraine despite lack of prior history. Given benign exam and imaging discharged to home with return precautions, and per Neuro recs was advised to follow up with Neurology and Ophthalmology post partum.   Venora MaplesMatthew M Bowen Goyal, MD/MPH 08/12/19 12:14 AM

## 2019-08-11 NOTE — MAU Note (Signed)
Virginia Haas is a 28 y.o. at [redacted]w[redacted]d here in MAU reporting: "woke up from a nap and I had a black area missing from my vision." Now having floaters. "The black stationary dot lasted for 54minutes. That is when they told me to come in".  Only on the left eye. Denies epigastric pain or swelling. Denies headache currently. Endorses one earlier. +nausea Onset of complaint: 4pm Pain score: denies at this time Vitals:   08/11/19 1719  BP: 118/79  Pulse: (!) 114  Resp: 17  Temp: 97.7 F (36.5 C)  SpO2: 98%    FHT: +FM; 157 Lab orders placed from triage: ua

## 2019-08-12 NOTE — Discharge Instructions (Signed)
You were seen for vision loss in your left eye. We obtained an MRI of your brain which was normal. We do not know why you had this vision loss at this point, be we have ruled out serious causes such as MS or a stroke.   You should follow up with a Neurologist and an Ophthalmologist after your pregnancy to discuss if there is any further workup or treatment required.  If you have this symptom again return to the hospital.

## 2019-09-04 DIAGNOSIS — Z3685 Encounter for antenatal screening for Streptococcus B: Secondary | ICD-10-CM | POA: Diagnosis not present

## 2019-09-10 DIAGNOSIS — L299 Pruritus, unspecified: Secondary | ICD-10-CM | POA: Diagnosis not present

## 2019-09-11 ENCOUNTER — Encounter (HOSPITAL_COMMUNITY): Payer: Self-pay

## 2019-09-11 ENCOUNTER — Telehealth (HOSPITAL_COMMUNITY): Payer: Self-pay | Admitting: *Deleted

## 2019-09-11 NOTE — Patient Instructions (Signed)
Virginia Haas  09/11/2019   Your procedure is scheduled on:  09/24/2019  Arrive at 0530 at Entrance C on [employer] at [facility]  and [employer]. You are invited to use the FREE valet parking or use the Visitor's parking deck.  Pick up the phone at the desk and dial [phone].  Call this number if you have problems the morning of surgery: [phone]  Remember:   Do not eat food:(After Midnight) Desps de medianoche.  Do not drink clear liquids: (After Midnight) Desps de medianoche.  Take these medicines the morning of surgery with A SIP OF WATER:  none   Do not wear jewelry, make-up or nail polish.  Do not wear lotions, powders, or perfumes. Do not wear deodorant.  Do not shave 48 hours prior to surgery.  Do not bring valuables to the hospital.  [facility] is not   responsible for any belongings or valuables brought to the hospital.  Contacts, dentures or bridgework may not be worn into surgery.  Leave suitcase in the car. After surgery it may be brought to your room.  For patients admitted to the hospital, checkout time is 11:00 AM the day of              discharge.      Please read over the following fact sheets that you were given:     Preparing for Surgery

## 2019-09-11 NOTE — Telephone Encounter (Signed)
Preadmission screen  

## 2019-09-22 ENCOUNTER — Other Ambulatory Visit (HOSPITAL_COMMUNITY)
Admission: RE | Admit: 2019-09-22 | Discharge: 2019-09-22 | Disposition: A | Discharge status: Home / Self Care | Payer: BC Managed Care – PPO | Source: Ambulatory Visit | Attending: Obstetrics and Gynecology | Admitting: Obstetrics and Gynecology

## 2019-09-22 ENCOUNTER — Other Ambulatory Visit: Payer: Self-pay

## 2019-09-22 DIAGNOSIS — Z01812 Encounter for preprocedural laboratory examination: Secondary | ICD-10-CM | POA: Insufficient documentation

## 2019-09-22 DIAGNOSIS — Z20822 Contact with and (suspected) exposure to covid-19: Secondary | ICD-10-CM | POA: Insufficient documentation

## 2019-09-22 LAB — CBC
HCT: 38 % (ref 36.0–46.0)
Hemoglobin: 13 g/dL (ref 12.0–15.0)
MCH: 31.6 pg (ref 26.0–34.0)
MCHC: 34.2 g/dL (ref 30.0–36.0)
MCV: 92.2 fL (ref 80.0–100.0)
Platelets: 184 10*3/uL (ref 150–400)
RBC: 4.12 MIL/uL (ref 3.87–5.11)
RDW: 12.6 % (ref 11.5–15.5)
WBC: 9.7 10*3/uL (ref 4.0–10.5)
nRBC: 0 % (ref 0.0–0.2)

## 2019-09-22 LAB — RPR: RPR Ser Ql: NONREACTIVE

## 2019-09-22 LAB — SARS CORONAVIRUS 2 (TAT 6-24 HRS): SARS Coronavirus 2: NEGATIVE

## 2019-09-22 LAB — TYPE AND SCREEN
ABO/RH(D): O POS
Antibody Screen: NEGATIVE

## 2019-09-22 LAB — ABO/RH: ABO/RH(D): O POS

## 2019-09-22 NOTE — MAU Note (Signed)
Pt here for PAT covid swab and lab draw. Denies symptoms. Swab collected. 

## 2019-09-23 NOTE — H&P (Signed)
Avanna Sowder is a 29 y.o.prime female presenting for scheduled elective delivery via cesarean section. Pt is dated per 7week [name]. She has had a benign prenatal course complicated only by nausea. She has a history of previous internal sphincterotomy for anal fissure. Was advised of need for elective c-section for delivery by surgeon. Essential panel and first trimester screen are all normal OB History    Gravida  1   Para      Term      Preterm      AB      Living        SAB      TAB      Ectopic      Multiple      Live Births             Past Medical History:  Diagnosis Date  . Chronic anal fissure    Past Surgical History:  Procedure Laterality Date  . lateral internal sphincteromy    . WISDOM TOOTH EXTRACTION     Family History: family history includes Cancer in her paternal grandfather; Crohn's disease in her maternal grandfather; Multiple sclerosis in her maternal grandfather; Parkinson's disease in her father. Social History:  reports that she has never smoked. She has never used smokeless tobacco. She reports previous alcohol use. She reports previous drug use.     Maternal Diabetes: No Genetic Screening: Normal Maternal Ultrasounds/Referrals: Normal Fetal Ultrasounds or other Referrals:  None Maternal Substance Abuse:  No Significant Maternal Medications:  None Significant Maternal Lab Results:  Group B Strep negative Other Comments:  None  Review of Systems  Constitutional: Negative for activity change, appetite change, diaphoresis and fatigue.  Respiratory: Negative for chest tightness and shortness of breath.   Cardiovascular: Negative for chest pain and palpitations.  Gastrointestinal: Positive for anal bleeding.  Genitourinary: Negative for pelvic pain.  Musculoskeletal: Negative for myalgias.  Neurological: Negative for headaches.  Psychiatric/Behavioral: The patient is nervous/anxious.    Maternal Medical History:  Reason for admission:  Scheduled elective cesarean section  Fetal activity: Perceived fetal activity is normal.   Last perceived fetal movement was within the past hour.    Prenatal complications: no prenatal complications Prenatal Complications - Diabetes: none.      Last menstrual period 12/25/2018. Maternal Exam:  Uterine Assessment: Contraction frequency is rare.   Abdomen: Estimated fetal weight is AGA.   Fetal presentation: vertex  Introitus: Normal vulva. Normal vagina.  Pelvis: adequate for delivery.   Cervix: Cervix evaluated by digital exam.     Physical Exam  Constitutional: She is oriented to person, place, and time. She appears well-developed.  Cardiovascular: Normal rate.  Respiratory: Effort normal.  GI: Soft.  Genitourinary:    Vulva, vagina and uterus normal.   Musculoskeletal:        General: No edema. Normal range of motion.     Cervical back: Normal range of motion.  Neurological: She is alert and oriented to person, place, and time.  Skin: Skin is warm.  Psychiatric: She has a normal mood and affect. Her behavior is normal. Judgment and thought content normal.    Prenatal labs: ABO, Rh: --/--/O POS, O POS Performed at [facility], [address] [place], [place] [address]  [phone]/31 0900) Antibody: NEG (01/31 0900) Rubella: Immune (07/06 0000) RPR: NON REACTIVE (01/31 0900)  HBsAg: Negative (07/06 0000)  HIV: Non-reactive (07/06 0000)  GBS:     Assessment/Plan: Prime at 62 5/7wks for primary cesarean  section due to history of anal surgery  Admit NPO Fetal monitoring prior to OR Ancef preop Consent verified.  To OR when ready   Cathrine Muster 09/23/2019, 4:14 PM

## 2019-09-24 ENCOUNTER — Encounter (HOSPITAL_COMMUNITY)
Admission: RE | Disposition: A | Discharge status: Home / Self Care | Payer: Self-pay | Source: Home / Self Care | Attending: Obstetrics and Gynecology

## 2019-09-24 ENCOUNTER — Inpatient Hospital Stay (HOSPITAL_COMMUNITY): Payer: BC Managed Care – PPO | Admitting: Anesthesiology

## 2019-09-24 ENCOUNTER — Other Ambulatory Visit: Payer: Self-pay

## 2019-09-24 ENCOUNTER — Inpatient Hospital Stay (HOSPITAL_COMMUNITY)
Admission: RE | Admit: 2019-09-24 | Discharge: 2019-09-27 | DRG: 788 | Disposition: A | Discharge status: Home / Self Care | Payer: BC Managed Care – PPO | Attending: Obstetrics and Gynecology | Admitting: Obstetrics and Gynecology

## 2019-09-24 ENCOUNTER — Encounter (HOSPITAL_COMMUNITY): Payer: Self-pay | Admitting: Obstetrics and Gynecology

## 2019-09-24 DIAGNOSIS — Z3490 Encounter for supervision of normal pregnancy, unspecified, unspecified trimester: Secondary | ICD-10-CM

## 2019-09-24 DIAGNOSIS — Z3A39 39 weeks gestation of pregnancy: Secondary | ICD-10-CM | POA: Diagnosis not present

## 2019-09-24 DIAGNOSIS — D649 Anemia, unspecified: Secondary | ICD-10-CM | POA: Diagnosis not present

## 2019-09-24 DIAGNOSIS — O9902 Anemia complicating childbirth: Secondary | ICD-10-CM | POA: Diagnosis present

## 2019-09-24 DIAGNOSIS — O9962 Diseases of the digestive system complicating childbirth: Secondary | ICD-10-CM | POA: Diagnosis present

## 2019-09-24 DIAGNOSIS — O26893 Other specified pregnancy related conditions, third trimester: Principal | ICD-10-CM | POA: Diagnosis present

## 2019-09-24 DIAGNOSIS — O3473 Maternal care for abnormality of vulva and perineum, third trimester: Secondary | ICD-10-CM | POA: Diagnosis not present

## 2019-09-24 DIAGNOSIS — Z98891 History of uterine scar from previous surgery: Secondary | ICD-10-CM

## 2019-09-24 DIAGNOSIS — Z8719 Personal history of other diseases of the digestive system: Secondary | ICD-10-CM | POA: Diagnosis not present

## 2019-09-24 DIAGNOSIS — K219 Gastro-esophageal reflux disease without esophagitis: Secondary | ICD-10-CM | POA: Diagnosis present

## 2019-09-24 DIAGNOSIS — Z3A00 Weeks of gestation of pregnancy not specified: Secondary | ICD-10-CM | POA: Diagnosis not present

## 2019-09-24 HISTORY — DX: History of uterine scar from previous surgery: Z98.891

## 2019-09-24 SURGERY — Surgical Case
Anesthesia: Spinal

## 2019-09-24 MED ORDER — NALBUPHINE HCL 10 MG/ML IJ SOLN: 5.0000 mg | INTRAMUSCULAR | Status: DC | PRN

## 2019-09-24 MED ORDER — DIBUCAINE (PERIANAL) 1 % EX OINT: 1.0000 "application " | TOPICAL_OINTMENT | CUTANEOUS | Status: DC | PRN

## 2019-09-24 MED ORDER — ACETAMINOPHEN 500 MG PO TABS
1000.0000 mg | ORAL_TABLET | ORAL | Status: AC
  Administered 2019-09-24: 06:00:00 1000 mg via ORAL

## 2019-09-24 MED ORDER — ONDANSETRON HCL 4 MG/2ML IJ SOLN
INTRAMUSCULAR | Status: AC
  Filled 2019-09-24: qty 2

## 2019-09-24 MED ORDER — SENNOSIDES-DOCUSATE SODIUM 8.6-50 MG PO TABS
2.0000 | ORAL_TABLET | ORAL | Status: DC
  Administered 2019-09-24 – 2019-09-25 (×2): 2 via ORAL
  Filled 2019-09-24 (×2): qty 2

## 2019-09-24 MED ORDER — WITCH HAZEL-GLYCERIN EX PADS: 1.0000 "application " | MEDICATED_PAD | CUTANEOUS | Status: DC | PRN

## 2019-09-24 MED ORDER — NALBUPHINE HCL 10 MG/ML IJ SOLN: 5.0000 mg | Freq: Once | INTRAMUSCULAR | Status: DC | PRN

## 2019-09-24 MED ORDER — ACETAMINOPHEN 160 MG/5ML PO SOLN: 325.0000 mg | Freq: Once | ORAL | Status: DC | PRN

## 2019-09-24 MED ORDER — PRENATAL MULTIVITAMIN CH
1.0000 | ORAL_TABLET | Freq: Every day | ORAL | Status: DC
  Administered 2019-09-25 – 2019-09-26 (×2): 1 via ORAL
  Filled 2019-09-24 (×2): qty 1

## 2019-09-24 MED ORDER — SCOPOLAMINE 1 MG/3DAYS TD PT72
MEDICATED_PATCH | TRANSDERMAL | Status: AC
  Filled 2019-09-24: qty 1

## 2019-09-24 MED ORDER — KETOROLAC TROMETHAMINE 30 MG/ML IJ SOLN
30.0000 mg | Freq: Four times a day (QID) | INTRAMUSCULAR | Status: AC | PRN
  Administered 2019-09-24: 30 mg via INTRAVENOUS
  Filled 2019-09-24: qty 1

## 2019-09-24 MED ORDER — KETOROLAC TROMETHAMINE 30 MG/ML IJ SOLN
30.0000 mg | Freq: Four times a day (QID) | INTRAMUSCULAR | Status: AC | PRN
  Administered 2019-09-24: 09:00:00 30 mg via INTRAMUSCULAR

## 2019-09-24 MED ORDER — MENTHOL 3 MG MT LOZG: 1.0000 | LOZENGE | OROMUCOSAL | Status: DC | PRN

## 2019-09-24 MED ORDER — SIMETHICONE 80 MG PO CHEW
80.0000 mg | CHEWABLE_TABLET | Freq: Three times a day (TID) | ORAL | Status: DC
  Administered 2019-09-24 – 2019-09-27 (×8): 80 mg via ORAL
  Filled 2019-09-24 (×8): qty 1

## 2019-09-24 MED ORDER — ACETAMINOPHEN 325 MG PO TABS: 325.0000 mg | ORAL_TABLET | Freq: Once | ORAL | Status: DC | PRN

## 2019-09-24 MED ORDER — OXYTOCIN 40 UNITS IN NORMAL SALINE INFUSION - SIMPLE MED
INTRAVENOUS | Status: AC
  Filled 2019-09-24: qty 1000

## 2019-09-24 MED ORDER — BUPIVACAINE IN DEXTROSE 0.75-8.25 % IT SOLN
INTRATHECAL | Status: DC | PRN
  Administered 2019-09-24: 1.6 mL via INTRATHECAL

## 2019-09-24 MED ORDER — LACTATED RINGERS IV SOLN: INTRAVENOUS | Status: DC

## 2019-09-24 MED ORDER — MORPHINE SULFATE (PF) 0.5 MG/ML IJ SOLN
INTRAMUSCULAR | Status: AC
  Filled 2019-09-24: qty 10

## 2019-09-24 MED ORDER — PHENYLEPHRINE HCL-NACL 20-0.9 MG/250ML-% IV SOLN
INTRAVENOUS | Status: DC | PRN
  Administered 2019-09-24: 60 ug/min via INTRAVENOUS

## 2019-09-24 MED ORDER — DIPHENHYDRAMINE HCL 25 MG PO CAPS: 25.0000 mg | ORAL_CAPSULE | ORAL | Status: DC | PRN

## 2019-09-24 MED ORDER — DIPHENHYDRAMINE HCL 25 MG PO CAPS: 25.0000 mg | ORAL_CAPSULE | Freq: Four times a day (QID) | ORAL | Status: DC | PRN

## 2019-09-24 MED ORDER — FENTANYL CITRATE (PF) 100 MCG/2ML IJ SOLN
INTRAMUSCULAR | Status: DC | PRN
  Administered 2019-09-24: 15 ug via INTRATHECAL

## 2019-09-24 MED ORDER — KETOROLAC TROMETHAMINE 30 MG/ML IJ SOLN
INTRAMUSCULAR | Status: AC
  Filled 2019-09-24: qty 1

## 2019-09-24 MED ORDER — FENTANYL CITRATE (PF) 100 MCG/2ML IJ SOLN: 25.0000 ug | INTRAMUSCULAR | Status: DC | PRN

## 2019-09-24 MED ORDER — COCONUT OIL OIL: 1.0000 "application " | TOPICAL_OIL | Status: DC | PRN

## 2019-09-24 MED ORDER — ACETAMINOPHEN 500 MG PO TABS
1000.0000 mg | ORAL_TABLET | Freq: Four times a day (QID) | ORAL | Status: DC | PRN
  Administered 2019-09-24 – 2019-09-27 (×8): 1000 mg via ORAL
  Filled 2019-09-24 (×8): qty 2

## 2019-09-24 MED ORDER — SIMETHICONE 80 MG PO CHEW
80.0000 mg | CHEWABLE_TABLET | ORAL | Status: DC
  Administered 2019-09-24 – 2019-09-26 (×3): 80 mg via ORAL
  Filled 2019-09-24 (×3): qty 1

## 2019-09-24 MED ORDER — SOD CITRATE-CITRIC ACID 500-334 MG/5ML PO SOLN
30.0000 mL | Freq: Once | ORAL | Status: AC
  Administered 2019-09-24: 07:00:00 30 mL via ORAL

## 2019-09-24 MED ORDER — DIPHENHYDRAMINE HCL 50 MG/ML IJ SOLN: 12.5000 mg | INTRAMUSCULAR | Status: DC | PRN

## 2019-09-24 MED ORDER — PHENYLEPHRINE HCL-NACL 20-0.9 MG/250ML-% IV SOLN
INTRAVENOUS | Status: AC
  Filled 2019-09-24: qty 250

## 2019-09-24 MED ORDER — DEXAMETHASONE SODIUM PHOSPHATE 10 MG/ML IJ SOLN
INTRAMUSCULAR | Status: DC | PRN
  Administered 2019-09-24: 8 mg via INTRAVENOUS

## 2019-09-24 MED ORDER — ONDANSETRON HCL 4 MG/2ML IJ SOLN
INTRAMUSCULAR | Status: DC | PRN
  Administered 2019-09-24: 4 mg via INTRAVENOUS

## 2019-09-24 MED ORDER — STERILE WATER FOR IRRIGATION IR SOLN
Status: DC | PRN
  Administered 2019-09-24: 1

## 2019-09-24 MED ORDER — IBUPROFEN 800 MG PO TABS
800.0000 mg | ORAL_TABLET | Freq: Three times a day (TID) | ORAL | Status: DC
  Administered 2019-09-24 – 2019-09-27 (×8): 800 mg via ORAL
  Filled 2019-09-24 (×8): qty 1

## 2019-09-24 MED ORDER — ZOLPIDEM TARTRATE 5 MG PO TABS: 5.0000 mg | ORAL_TABLET | Freq: Every evening | ORAL | Status: DC | PRN

## 2019-09-24 MED ORDER — OXYTOCIN 40 UNITS IN NORMAL SALINE INFUSION - SIMPLE MED: 2.5000 [IU]/h | INTRAVENOUS | Status: AC

## 2019-09-24 MED ORDER — MORPHINE SULFATE (PF) 0.5 MG/ML IJ SOLN
INTRAMUSCULAR | Status: DC | PRN
  Administered 2019-09-24: .15 mg via INTRATHECAL

## 2019-09-24 MED ORDER — ACETAMINOPHEN 500 MG PO TABS
ORAL_TABLET | ORAL | Status: AC
  Filled 2019-09-24: qty 2

## 2019-09-24 MED ORDER — SOD CITRATE-CITRIC ACID 500-334 MG/5ML PO SOLN
ORAL | Status: AC
  Filled 2019-09-24: qty 30

## 2019-09-24 MED ORDER — ACETAMINOPHEN 10 MG/ML IV SOLN: 1000.0000 mg | Freq: Once | INTRAVENOUS | Status: DC | PRN

## 2019-09-24 MED ORDER — SIMETHICONE 80 MG PO CHEW: 80.0000 mg | CHEWABLE_TABLET | ORAL | Status: DC | PRN

## 2019-09-24 MED ORDER — CEFAZOLIN SODIUM-DEXTROSE 2-4 GM/100ML-% IV SOLN
INTRAVENOUS | Status: AC
  Filled 2019-09-24: qty 100

## 2019-09-24 MED ORDER — SCOPOLAMINE 1 MG/3DAYS TD PT72
1.0000 | MEDICATED_PATCH | Freq: Once | TRANSDERMAL | Status: DC
  Administered 2019-09-24: 07:00:00 1.5 mg via TRANSDERMAL

## 2019-09-24 MED ORDER — MAGNESIUM HYDROXIDE 400 MG/5ML PO SUSP: 30.0000 mL | ORAL | Status: DC | PRN

## 2019-09-24 MED ORDER — CEFAZOLIN SODIUM-DEXTROSE 2-4 GM/100ML-% IV SOLN
2.0000 g | INTRAVENOUS | Status: AC
  Administered 2019-09-24: 08:00:00 2 g via INTRAVENOUS

## 2019-09-24 MED ORDER — PROMETHAZINE HCL 25 MG/ML IJ SOLN: 6.2500 mg | INTRAMUSCULAR | Status: DC | PRN

## 2019-09-24 MED ORDER — MEPERIDINE HCL 25 MG/ML IJ SOLN: 6.2500 mg | INTRAMUSCULAR | Status: DC | PRN

## 2019-09-24 MED ORDER — NALOXONE HCL 0.4 MG/ML IJ SOLN: 0.4000 mg | INTRAMUSCULAR | Status: DC | PRN

## 2019-09-24 MED ORDER — ONDANSETRON HCL 4 MG/2ML IJ SOLN: 4.0000 mg | Freq: Three times a day (TID) | INTRAMUSCULAR | Status: DC | PRN

## 2019-09-24 MED ORDER — DEXAMETHASONE SODIUM PHOSPHATE 10 MG/ML IJ SOLN
INTRAMUSCULAR | Status: AC
  Filled 2019-09-24: qty 1

## 2019-09-24 MED ORDER — SCOPOLAMINE 1 MG/3DAYS TD PT72: 1.0000 | MEDICATED_PATCH | Freq: Once | TRANSDERMAL | Status: DC

## 2019-09-24 MED ORDER — OXYTOCIN 40 UNITS IN NORMAL SALINE INFUSION - SIMPLE MED
INTRAVENOUS | Status: DC | PRN
  Administered 2019-09-24: 750 mL/h via INTRAVENOUS

## 2019-09-24 MED ORDER — OXYCODONE HCL 5 MG PO TABS: 5.0000 mg | ORAL_TABLET | ORAL | Status: DC | PRN

## 2019-09-24 MED ORDER — FENTANYL CITRATE (PF) 100 MCG/2ML IJ SOLN
INTRAMUSCULAR | Status: AC
  Filled 2019-09-24: qty 2

## 2019-09-24 MED ORDER — NALOXONE HCL 4 MG/10ML IJ SOLN
1.0000 ug/kg/h | INTRAVENOUS | Status: DC | PRN
  Filled 2019-09-24: qty 5

## 2019-09-24 MED ORDER — SODIUM CHLORIDE 0.9 % IR SOLN
Status: DC | PRN
  Administered 2019-09-24: 1

## 2019-09-24 MED ORDER — SODIUM CHLORIDE 0.9% FLUSH: 3.0000 mL | INTRAVENOUS | Status: DC | PRN

## 2019-09-24 MED ORDER — TETANUS-DIPHTH-ACELL PERTUSSIS 5-2.5-18.5 LF-MCG/0.5 IM SUSP: 0.5000 mL | Freq: Once | INTRAMUSCULAR | Status: DC

## 2019-09-24 SURGICAL SUPPLY — 37 items
BENZOIN TINCTURE PRP APPL 2/3 (GAUZE/BANDAGES/DRESSINGS) ×6 IMPLANT
CHLORAPREP W/TINT 26ML (MISCELLANEOUS) ×3 IMPLANT
CLAMP CORD UMBIL (MISCELLANEOUS) IMPLANT
CLOSURE WOUND 1/2 X4 (GAUZE/BANDAGES/DRESSINGS) ×2
CLOTH BEACON ORANGE TIMEOUT ST (SAFETY) ×3 IMPLANT
DRAPE C SECTION CLR SCREEN (DRAPES) ×3 IMPLANT
DRSG OPSITE POSTOP 4X10 (GAUZE/BANDAGES/DRESSINGS) ×3 IMPLANT
ELECT REM PT RETURN 9FT ADLT (ELECTROSURGICAL) ×3
ELECTRODE REM PT RTRN 9FT ADLT (ELECTROSURGICAL) ×1 IMPLANT
EXTRACTOR VACUUM KIWI (MISCELLANEOUS) IMPLANT
GLOVE BIO SURGEON STRL SZ 6.5 (GLOVE) ×2 IMPLANT
GLOVE BIO SURGEONS STRL SZ 6.5 (GLOVE) ×1
GLOVE BIOGEL PI IND STRL 7.0 (GLOVE) ×2 IMPLANT
GLOVE BIOGEL PI INDICATOR 7.0 (GLOVE) ×4
GOWN STRL REUS W/TWL LRG LVL3 (GOWN DISPOSABLE) ×6 IMPLANT
KIT ABG SYR 3ML LUER SLIP (SYRINGE) IMPLANT
NEEDLE HYPO 25X5/8 SAFETYGLIDE (NEEDLE) IMPLANT
NS IRRIG 1000ML POUR BTL (IV SOLUTION) ×3 IMPLANT
PACK C SECTION WH (CUSTOM PROCEDURE TRAY) ×3 IMPLANT
PAD OB MATERNITY 4.3X12.25 (PERSONAL CARE ITEMS) ×3 IMPLANT
RETRACTOR WND ALEXIS 25 LRG (MISCELLANEOUS) ×1 IMPLANT
RTRCTR C-SECT PINK 25CM LRG (MISCELLANEOUS) IMPLANT
RTRCTR WOUND ALEXIS 25CM LRG (MISCELLANEOUS) ×3
STRIP CLOSURE SKIN 1/2X4 (GAUZE/BANDAGES/DRESSINGS) ×4 IMPLANT
SUT CHROMIC 1 CTX 36 (SUTURE) ×6 IMPLANT
SUT PLAIN 0 NONE (SUTURE) IMPLANT
SUT PLAIN 2 0 XLH (SUTURE) ×3 IMPLANT
SUT VIC AB 0 CT1 27 (SUTURE) ×4
SUT VIC AB 0 CT1 27XBRD ANBCTR (SUTURE) ×2 IMPLANT
SUT VIC AB 2-0 CT1 27 (SUTURE) ×4
SUT VIC AB 2-0 CT1 TAPERPNT 27 (SUTURE) ×2 IMPLANT
SUT VIC AB 3-0 CT1 27 (SUTURE)
SUT VIC AB 3-0 CT1 TAPERPNT 27 (SUTURE) IMPLANT
SUT VIC AB 4-0 KS 27 (SUTURE) ×3 IMPLANT
TOWEL OR 17X24 6PK STRL BLUE (TOWEL DISPOSABLE) ×3 IMPLANT
TRAY FOLEY W/BAG SLVR 14FR LF (SET/KITS/TRAYS/PACK) ×3 IMPLANT
WATER STERILE IRR 1000ML POUR (IV SOLUTION) ×3 IMPLANT

## 2019-09-24 NOTE — Anesthesia Procedure Notes (Signed)
Spinal  Start time: 09/24/2019 7:39 AM End time: 09/24/2019 7:41 AM Staffing Performed: anesthesiologist  Anesthesiologist: Shelton Silvas, MD Preanesthetic Checklist Completed: patient identified, IV checked, site marked, risks and benefits discussed, surgical consent, monitors and equipment checked, pre-op evaluation and timeout performed Spinal Block Patient position: sitting Prep: DuraPrep and site prepped and draped Location: L3-4 Injection technique: single-shot Needle Needle type: Pencan  Needle gauge: 24 G Needle length: 10 cm Needle insertion depth: 10 cm Additional Notes Patient tolerated well. No immediate complications.

## 2019-09-24 NOTE — Transfer of Care (Signed)
Immediate Anesthesia Transfer of Care Note  Patient: Jiah Bari  Procedure(s) Performed: CESAREAN SECTION (N/A )  Patient Location: PACU  Anesthesia Type:Spinal  Level of Consciousness: awake, alert  and patient cooperative  Airway & Oxygen Therapy: Patient Spontanous Breathing  Post-op Assessment: Report given to RN and Post -op Vital signs reviewed and stable  Post vital signs: Reviewed and stable  Last Vitals:  Vitals Value Taken Time  BP 104/66 09/24/19 0847  Temp    Pulse 79 09/24/19 0850  Resp 19 09/24/19 0850  SpO2 98 % 09/24/19 0850  Vitals shown include unvalidated device data.  Last Pain:  Vitals:   09/24/19 0847  TempSrc:   PainSc: (P) 0-No pain         Complications: No apparent anesthesia complications  See PACU flow sheet for vital signs

## 2019-09-24 NOTE — Interval H&P Note (Signed)
History and Physical Interval Note: Pt doing well with no complaints. Reviewed procedure: risks and precautions planned, and post op expectations All questions answered To OR when ready  09/24/2019 7:40 AM  Virginia Haas  has presented today for surgery, with the diagnosis of primary c-section, history of anal fissures.  The various methods of treatment have been discussed with the patient and family. After consideration of risks, benefits and other options for treatment, the patient has consented to  Procedure(s) with comments: CESAREAN SECTION (N/A) - request RNFA as a surgical intervention.  The patient's history has been reviewed, patient examined, no change in status, stable for surgery.  I have reviewed the patient's chart and labs.  Questions were answered to the patient's satisfaction.     Cathrine Muster

## 2019-09-24 NOTE — Anesthesia Postprocedure Evaluation (Signed)
Anesthesia Post Note  Patient: Virginia Haas  Procedure(s) Performed: CESAREAN SECTION (N/A )     Patient location during evaluation: PACU Anesthesia Type: Spinal Level of consciousness: oriented and awake and alert Pain management: pain level controlled Vital Signs Assessment: post-procedure vital signs reviewed and stable Respiratory status: spontaneous breathing, respiratory function stable and patient connected to nasal cannula oxygen Cardiovascular status: blood pressure returned to baseline and stable Postop Assessment: no headache, no backache, no apparent nausea or vomiting and spinal receding Anesthetic complications: no    Last Vitals:  Vitals:   09/24/19 1005 09/24/19 1100  BP: 109/74 117/80  Pulse: (!) 56 (!) 52  Resp: 18 18  Temp: 36.5 C 36.6 C  SpO2: 99% 97%    Last Pain:  Vitals:   09/24/19 1100  TempSrc:   PainSc: 0-No pain   Pain Goal: Patients Stated Pain Goal: 2 (09/24/19 1005)                 Shelton Silvas

## 2019-09-24 NOTE — Anesthesia Preprocedure Evaluation (Addendum)
Anesthesia Evaluation  Patient identified by MRN, date of birth, ID band Patient awake    Reviewed: Allergy & Precautions, NPO status , Patient's Chart, lab work & pertinent test results  Airway Mallampati: II  TM Distance: >3 FB Neck ROM: Full    Dental  (+) Teeth Intact, Dental Advisory Given   Pulmonary neg pulmonary ROS,    breath sounds clear to auscultation       Cardiovascular negative cardio ROS   Rhythm:Regular Rate:Normal     Neuro/Psych negative neurological ROS  negative psych ROS   GI/Hepatic Neg liver ROS, GERD  Medicated,  Endo/Other  negative endocrine ROS  Renal/GU negative Renal ROS     Musculoskeletal negative musculoskeletal ROS (+)   Abdominal   Peds  Hematology negative hematology ROS (+)   Anesthesia Other Findings   Reproductive/Obstetrics                            Anesthesia Physical Anesthesia Plan  ASA: II  Anesthesia Plan: Spinal   Post-op Pain Management:    Induction:   PONV Risk Score and Plan: Ondansetron and Treatment may vary due to age or medical condition  Airway Management Planned: Natural Airway  Additional Equipment: None  Intra-op Plan:   Post-operative Plan:   Informed Consent: I have reviewed the patients History and Physical, chart, labs and discussed the procedure including the risks, benefits and alternatives for the proposed anesthesia with the patient or authorized representative who has indicated his/her understanding and acceptance.       Plan Discussed with: CRNA  Anesthesia Plan Comments: (Lab Results      Component                Value               Date                      WBC                      9.7                 09/22/2019                HGB                      13.0                09/22/2019                HCT                      38.0                09/22/2019                MCV                      92.2                 09/22/2019                PLT                      184                 09/22/2019           )  Anesthesia Quick Evaluation  

## 2019-09-24 NOTE — Op Note (Addendum)
Operative Note    Preoperative Diagnosis: IUP at 39 weeks                                             History of anorectal surgery   Postoperative Diagnosis: Same   Procedure: Primary low transverse cesarean section   Surgeon: Britt Bottom DO Assist: Colon Branch ; first assist Anesthesia: SPinal  Fluids: LR EBL: UOP:   Findings: Viable female infant in vertex position. Apgars 9,9, weight pending. Grossly normal uterus, tubes and ovaries   Specimen: Placenta to pathology   Procedure Note Pt with a history of anorectal surgery and was advised to avoid vaginal delivery to maintain integrity. Pt opted for elective primary cesarean section. Patient was taken to the operating room where spinal anesthesia was administered, tested and found to be adequate. She was placed in the dorsal supine position with a leftward tilt. A foley was placed  in a sterile manner to drain the bladder.  Pt was prepped and draped in the usual sterile fashion. An appropriate time out was performed. Allis clamp test confirmed adequate anesthesia. A Pfannenstiel skin incision was then made two finger breaths above the pubic symphysis with the scalpel and carried through to the underlying layer of fascia by sharp dissection and Bovie cautery. The fascia was nicked in the midline and the incision was extended laterally with Mayo scissors. The superior, then inferior, aspects of the incision were grasped with kocher clamps and dissected off the underlying rectus muscles. Rectus muscles were separated in the midline and the peritoneal cavity entered bluntly. The peritoneal incision was then extended both superiorly and inferiorly with careful attention to avoid both bowel and bladder. The Alexis self-retaining wound retractor was then placed within the incision and the lower uterine segment exposed. The bladder flap was developed with Metzenbaum scissors and pushed away from the lower uterine segment. The  lower uterine segment was then incised in a transverse fashion and the cavity itself entered bluntly.  The vertex was then gently  delivered without difficulty. Anterior and posterior shoulders delivered easily next; body followed. Thin meconium noted. Baby did have a vigorous spontaneous cry on delivery.  Bulb suction of mouth and nose was performed.  After a minute delay, the cord was clamped and cut. The infant was handed off to the waiting NICU team. The placenta was then spontaneously expressed from the uterus and the uterus cleared of all clots and debris with moist lap sponge. The uterine incision was inspected.The uterine incision was then repaired in 2 layers:  the first layer was a running locked layer 0 chromic and the second an imbricating layer of the same suture. The tubes and ovaries were inspected and the gutters cleared of all clots and debris. The uterine incision was inspected again and found to be hemostatic. All instruments and sponges as well as the Alexis retractor were then removed from the abdomen. The  peritoneum and the rectus muscles were reapproximatedl using 2-0 vicryl. . The fascia was then closed with 0 Vicryl in a running fashion. Subcutaneous tissue was reapproximated with 3-0 plain in a running fashion. The skin was closed with a subcuticular stitch of 4-0 Vicryl on a Keith needle and then reinforced with benzoin and Steri-Strips. At the conclusion of the procedure all instruments and sponge counts were correct. Patient was taken to the recovery  room in good condition with her baby accompanying her skin to skin.

## 2019-09-24 NOTE — Lactation Note (Signed)
This note was copied from a baby's chart. Lactation Consultation Note  Patient Name: [name] NIDPO'E Date: 09/24/2019 Reason for consult: Initial assessment;Primapara;1st time breastfeeding;Term  LC in to visit with P1 Mom of term baby delivered by C/Section.  Baby 6 hrs old.    Baby has latched 5 times since birth.  Mom reports a deep latch to breast.  Mom has large breasts and erect nipples and compressible areola.  Reviewed breast massage and hand expression, colostrum drops expressed.   Gently moved baby over to breast, but baby became fussy.  Left baby STS prone on Mom's chest.  Mom states this position feels the best.  Baby sound asleep.    Mom took online breastfeeding class.  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  Spent time answering her questions and doing some deep breathing to relax her some.  Baby sleeping throughout all the chatter.  Mom needing much reassurance.  Encouraged Mom to call prn for latch assist.  Mom's right nipple tip is slightly bruised from an earlier shallow latch.  Mom aware of how to de-latch baby.  Colostrum dabbed onto nipple tips for healing.  Lactation brochure left with Mom.  Mom aware of IP and OP lactation support available to her and encouraged her to call for assistance prn.  Interventions Interventions: Breast feeding basics reviewed;Skin to skin;Breast massage;Hand express;Support pillows;Position options   Consult Status Consult Status: Follow-up Date: 09/25/19 Follow-up type: In-patient    [name] 09/24/2019, 2:15 PM

## 2019-09-25 LAB — CBC
HCT: 27.6 % — ABNORMAL LOW (ref 36.0–46.0)
Hemoglobin: 9.3 g/dL — ABNORMAL LOW (ref 12.0–15.0)
MCH: 32.2 pg (ref 26.0–34.0)
MCHC: 33.7 g/dL (ref 30.0–36.0)
MCV: 95.5 fL (ref 80.0–100.0)
Platelets: 145 10*3/uL — ABNORMAL LOW (ref 150–400)
RBC: 2.89 MIL/uL — ABNORMAL LOW (ref 3.87–5.11)
RDW: 12.8 % (ref 11.5–15.5)
WBC: 11.2 10*3/uL — ABNORMAL HIGH (ref 4.0–10.5)
nRBC: 0 % (ref 0.0–0.2)

## 2019-09-25 LAB — BIRTH TISSUE RECOVERY COLLECTION (PLACENTA DONATION)

## 2019-09-25 MED ORDER — FAMOTIDINE 20 MG PO TABS
20.0000 mg | ORAL_TABLET | Freq: Every evening | ORAL | Status: DC | PRN
  Administered 2019-09-25 – 2019-09-27 (×2): 20 mg via ORAL
  Filled 2019-09-25 (×2): qty 1

## 2019-09-25 MED ORDER — CALCIUM CARBONATE ANTACID 500 MG PO CHEW: 1.0000 | CHEWABLE_TABLET | ORAL | Status: DC | PRN

## 2019-09-25 NOTE — Progress Notes (Signed)
Subjective: Postpartum Day 1: Cesarean Delivery Patient reports incisional pain and tolerating PO.  Nl lochia, pain controlled.   Objective: Vital signs in last 24 hours: Temp:  [97.7 F (36.5 C)-98.7 F (37.1 C)] 98.5 F (36.9 C) (02/03 0445) Pulse Rate:  [52-78] 78 (02/03 0445) Resp:  [13-18] 16 (02/03 0445) BP: (101-125)/(58-80) 101/58 (02/03 0445) SpO2:  [97 %-100 %] 98 % (02/03 0445)  Physical Exam:  General: alert and no distress Lochia: appropriate Uterine Fundus: firm Incision: healing well DVT Evaluation: No evidence of DVT seen on physical exam.  Recent Labs    09/22/19 0900 09/25/19 0556  HGB 13.0 9.3*  HCT 38.0 27.6*    Assessment/Plan: Status post Cesarean section. Doing well postoperatively.  Continue current care/Rotuine PP/PostOp care care.  Virginia Haas 09/25/2019, 8:21 AM

## 2019-09-25 NOTE — Lactation Note (Signed)
This note was copied from a baby's chart. Lactation Consultation Note  Patient Name: Virginia Haas BZJIR'C Date: 09/25/2019 Reason for consult: Follow-up assessment;Primapara;1st time breastfeeding;Term  2041 - 2108 - I followed up with [name]. She states that she has been breast feeding her daughter, [name], today, and she has sustained a small injury to her right breast. She states that it bled and she wanted to verify that a small amount of blood in the breast milk would not hurt [name]. She also wanted to know if she should continue breast feeding on that side.  I assisted with latching [name] to the right breast in cross cradle hold. I demonstrated the U hold and encouraged breast compression while latching. I noted a compression stripe on the underside of the right nipple. It appeared to be off-center almost from poor positioning. Ms. Ahlgren states that it happened during one feeding; she knew it was painful but had difficulty removing [name] from the breast.  We latched [name] to the right breast, and initially this side was painful. It gradually became less painful. [name] became sleepy at the breast fairly quickly and I showed parents ways to gently pester her. Eventually she released the breast and fell asleep.  I educated on day 1 and day 2 infant feeding patterns and signs that baby is getting enough o eat. I encouraged [name] to breast feed on demand 8-12 times a day and to do a little pumping in between with her manual pump. She states that it's difficulty to pre-pump because [name] goes from "0 to 60" with her feeding cues.  I spent a little time providing some education and reassurance as to positives with [name]'s breast feeding. I praised [name] for her hard work. I also suggested that a follow up OP visit might help further her along her breast feeding journey.  All questions answered at this time.  Maternal Data Formula Feeding for Exclusion: No Has patient been taught Hand  Expression?: Yes Does the patient have breastfeeding experience prior to this delivery?: No  Feeding Feeding Type: Breast Fed  LATCH Score Latch: Repeated attempts needed to sustain latch, nipple held in mouth throughout feeding, stimulation needed to elicit sucking reflex.  Audible Swallowing: A few with stimulation  Type of Nipple: Everted at rest and after stimulation  Comfort (Breast/Nipple): Filling, red/small blisters or bruises, mild/mod discomfort  Hold (Positioning): Assistance needed to correctly position infant at breast and maintain latch.  LATCH Score: 6  Interventions Interventions: Breast feeding basics reviewed;Assisted with latch;Hand express;Breast compression;Adjust position;Support pillows  Lactation Tools Discussed/Used Pump Review: Setup, frequency, and cleaning   Consult Status Consult Status: Follow-up Date: 09/26/19 Follow-up type: In-patient    [name] 09/25/2019, 9:35 PM

## 2019-09-26 MED ORDER — FERROUS SULFATE 325 (65 FE) MG PO TABS
325.0000 mg | ORAL_TABLET | Freq: Every day | ORAL | Status: DC
  Administered 2019-09-27: 325 mg via ORAL
  Filled 2019-09-26: qty 1

## 2019-09-26 MED ORDER — DOCUSATE SODIUM 100 MG PO CAPS
100.0000 mg | ORAL_CAPSULE | Freq: Two times a day (BID) | ORAL | Status: DC
  Administered 2019-09-26 – 2019-09-27 (×3): 100 mg via ORAL
  Filled 2019-09-26 (×3): qty 1

## 2019-09-26 NOTE — Lactation Note (Signed)
This note was copied from a baby's chart. Lactation Consultation Note  Patient Name: Virginia Haas EVOJJ'K Date: 09/26/2019 Reason for consult: Follow-up assessment;Nipple pain/trauma;Term;Primapara Baby is 54 hours old/7% weight loss.  Mom states she is finding it very hard to recover from a C/S and breastfeed.  She is c/o incisional pain.  Mom is worried about previous nipple trauma to right side.  Mom is wearing shells.  Positional stripe noted on right nipple.  Baby is currently sucking on a pacifier.  She knows it is not recommended to use a pacifier in the first few weeks.  Reinforced importance of limiting use.  Explained to parents that baby is cueing and cluster feeding is normal.  Instructed to feed with any cue.  Mom is very anxious and repeats feeling overwhelmed with breastfeeding and recovery.  A lot of support and reassurance given.  Mom decided to put baby to breast.  Assisted with positioning baby in football hold on right side.  Parents shown how to use good breast support and compression.  Baby opened wide and latched easily.  Mom feeling slight discomfort.  Latch appears deep and lips flanged.  Reviewed waking techniques and breast massage.  Observed baby for 20 minutes.  Reminded mom to hold baby close.  When baby came off nipple was slightly pinched.  Milk dripping down breast.  Recommended bringing up a little higher next time.  Reviewed cluster feeding.  Encouraged to call for assist/concerns prn.  Maternal Data    Feeding Feeding Type: Breast Fed  LATCH Score Latch: Grasps breast easily, tongue down, lips flanged, rhythmical sucking.  Audible Swallowing: Spontaneous and intermittent  Type of Nipple: Everted at rest and after stimulation  Comfort (Breast/Nipple): Filling, red/small blisters or bruises, mild/mod discomfort  Hold (Positioning): Assistance needed to correctly position infant at breast and maintain latch.  LATCH Score:  8  Interventions Interventions: Breast compression;Assisted with latch;Adjust position;Skin to skin;Support pillows;Breast massage;Position options;Hand express  Lactation Tools Discussed/Used Tools: Shells Shell Type: Inverted   Consult Status Consult Status: Follow-up Date: 09/27/19 Follow-up type: In-patient    Huston Foley 09/26/2019, 2:51 PM

## 2019-09-26 NOTE — Progress Notes (Signed)
Subjective: Postpartum Day 2: Cesarean Delivery Patient reports incisional pain and tolerating PO.  Nl lochia, pain improved w/ PO meds. Only issue is crack on nipple, using nipple shields and lanolin.   Objective: Vital signs in last 24 hours: Temp:  [98.1 F (36.7 C)-98.4 F (36.9 C)] 98.1 F (36.7 C) (02/04 0507) Pulse Rate:  [75-82] 75 (02/04 0507) Resp:  [16] 16 (02/04 0507) BP: (112-124)/(76-81) 112/76 (02/04 0507) SpO2:  [99 %-100 %] 100 % (02/04 0507)  Physical Exam:  General: alert and no distress Lochia: appropriate Uterine Fundus: firm Incision: healing well, serosanguinous drainage on Honeycomb. DVT Evaluation: No evidence of DVT seen on physical exam.  Recent Labs    09/25/19 0556  HGB 9.3*  HCT 27.6*    Assessment/Plan: POD#2 s/p Cesarean section (elective PLTCS for h/o anorectal surgery) . Doing well postoperatively.  Continue current care/Rotuine PP/PostOp care care. Desires discharge on POD#3 for continued aid in lactation and improved pain control  Post-op H/H 9.3/27.6: asx from anemia, will place on QD Fe/colace  Virginia Haas Virginia Haas 09/26/2019, 9:13 AM

## 2019-09-27 MED ORDER — ACETAMINOPHEN 500 MG PO TABS: 1000.0000 mg | ORAL_TABLET | Freq: Four times a day (QID) | ORAL | 0 refills | Status: DC | PRN

## 2019-09-27 MED ORDER — IBUPROFEN 800 MG PO TABS: 800.0000 mg | ORAL_TABLET | Freq: Three times a day (TID) | ORAL | 0 refills | Status: DC

## 2019-09-27 NOTE — Lactation Note (Signed)
This note was copied from a baby's chart. Lactation Consultation Note  Patient Name: Virginia Haas Date: 09/27/2019 Reason for consult: Follow-up assessment  P1 mother whose infant is now 80 hours old.  This is a term infant with a 9% weight loss today.  Mother had just finished feeding when I arrived.  Baby was swaddled and fell asleep in her arms.    Mother had many questions and concerns.  Overall, she has been overwhelmed and is anxious.  Spent a considerable amount of time reviewing breast feeding and answering her questions.  Provided praise and encouragement.  I feel like parents have learned a lot since delivery and now it is a matter of patience and practice to get to the ultimate goal of exclusively breast feeding well.  Provided many helpful suggestions for parents in regards to caring for their baby.  Mother stated that her daughter gets very restless and it is hard to get her to latch when baby is fussy and mother is trying to latch.  I suggested mother use her EBM as an "appetizer" for baby prior to latching.  Explained that mother could get herself positioned and ready while father gives some EBM via the feeding cup (which they both seem very interested in using).  Once the baby is a little more calm, the mother can also be more calm which would make latching easier and more successful.  Parents will definitely try this suggestion at home.    Reviewed basics including feeding STS, hand expression, breast massage, how to obtain and maintain a good latch, feeding cues, supplementation and calming techniques.  Demonstrated foley cup using water.  Mother will use her hand pump/DEBP as needed.  Engorgement prevention/treatment discussed.  Suggested she use EBM and her organic nipple cream after every feeding to nipple comfort.  Mother has had difficulty with proper latching but nipples are healing.  The right nipple is bruised but the left nipple is without trauma at this  time.  Father present and very supportive; enjoyed working with this family as both parents are very receptive to learning and eager to be successful.  Mother has our OP phone number for questions/concerns after discharge.  Suggested an OP LC visit with Jasmine December and message sent via computer.  Father will be calling the insurance company today to determine eligibility.  RN updated.   Maternal Data    Feeding Feeding Type: Breast Fed  LATCH Score                   Interventions    Lactation Tools Discussed/Used     Consult Status Consult Status: Complete Date: 09/27/19 Follow-up type: Call as needed    Virginia Haas R [name] 09/27/2019, 9:08 AM

## 2019-09-27 NOTE — Discharge Summary (Signed)
OB Discharge Summary     Patient Name: Virginia Haas DOB: [birth year] MRN: [id number]  Date of admission: 09/24/2019 Delivering MD: Pryor Ochoa [facility]   Date of discharge: 09/27/2019  Admitting diagnosis: Postpartum care following cesarean delivery [Z39.2] Term pregnancy [Z34.90] Intrauterine pregnancy: [redacted]w[redacted]d     Secondary diagnosis:  Active Problems:   Status post primary low transverse cesarean section   Postpartum care following cesarean delivery   Term pregnancy  Additional problems: none     Discharge diagnosis: Term Pregnancy Delivered                                                                                                Post partum procedures:none   Complications: None  Hospital course:  Sceduled C/S   29 y.o. yo G1P1001 at [redacted]w[redacted]d was admitted to the hospital 09/24/2019 for scheduled cesarean section with the following indication:prior anal sphincter surgery.  Membrane Rupture Time/Date: 8:04 AM ,09/24/2019   Patient delivered a Viable infant.09/24/2019  Details of operation can be found in separate operative note.  Pateint had an uncomplicated postpartum course.  She is ambulating, tolerating a regular diet, passing flatus, and urinating well. Patient is discharged home in stable condition on  09/27/19         Physical exam  Vitals:   09/25/19 2036 09/26/19 0507 09/26/19 2201 09/27/19 0643  BP: 124/81 112/76 115/79 120/84  Pulse: 82 75 67 62  Resp: 16 16  18   Temp: 98.4 F (36.9 C) 98.1 F (36.7 C) 98 F (36.7 C) (!) 97.5 F (36.4 C)  TempSrc: Oral Oral Axillary Axillary  SpO2: 99% 100% 99% 100%  Weight:      Height:       General: alert and cooperative Lochia: appropriate Uterine Fundus: firm Incision: Dressing is clean, dry, and intact  Labs: Lab Results  Component Value Date   WBC 11.2 (H) 09/25/2019   HGB 9.3 (L) 09/25/2019   HCT 27.6 (L) 09/25/2019   MCV 95.5 09/25/2019   PLT 145 (L) 09/25/2019   CMP Latest Ref Rng & Units 08/11/2019   Glucose 70 - 99 mg/dL 95  BUN 6 - 20 mg/dL 08/13/2019)  Creatinine <3(Z - 1.00 mg/dL 7.67  Sodium 3.41 - 937 mmol/L 136  Potassium 3.5 - 5.1 mmol/L 3.4(L)  Chloride 98 - 111 mmol/L 107  CO2 22 - 32 mmol/L 22  Calcium 8.9 - 10.3 mg/dL 902)  Total Protein 6.5 - 8.1 g/dL 4.0(X)  Total Bilirubin 0.3 - 1.2 mg/dL 0.3  Alkaline Phos 38 - 126 U/L 76  AST 15 - 41 U/L 14(L)  ALT 0 - 44 U/L 12    Discharge instruction: per After Visit Summary and "Baby and Me Booklet".  After visit meds:  Allergies as of 09/27/2019   No Known Allergies     Medication List    TAKE these medications   acetaminophen 500 MG tablet Commonly known as: TYLENOL Take 2 tablets (1,000 mg total) by mouth every 6 (six) hours as needed for moderate pain.   calcium carbonate 500 MG chewable tablet Commonly known as: TUMS -  dosed in mg elemental calcium Chew 2 tablets by mouth daily as needed for indigestion or heartburn.   docusate sodium 100 MG capsule Commonly known as: COLACE Take 100 mg by mouth daily.   famotidine 20 MG tablet Commonly known as: PEPCID Take 20 mg by mouth daily.   ibuprofen 800 MG tablet Commonly known as: ADVIL Take 1 tablet (800 mg total) by mouth every 8 (eight) hours.   prenatal multivitamin Tabs tablet Take 1 tablet by mouth daily at 12 noon.       Diet: routine diet  Activity: Advance as tolerated. Pelvic rest for 6 weeks.   Outpatient follow up:2 weeks Follow up Appt:No future appointments. Follow up Visit:No follow-ups on file.  Postpartum contraception: IUD Mirena  Newborn Data: Live born female  Birth Weight: 9 lb 1 oz (4110 g) APGAR: 39, 32  Newborn Delivery   Birth date/time: 09/24/2019 08:05:00 Delivery type: C-Section, Low Transverse Trial of labor: No C-section categorization: Primary      Baby Feeding: Breast Disposition:home with mother   09/27/2019 Logan Bores, MD

## 2019-09-27 NOTE — Progress Notes (Signed)
Subjective: Postpartum Day 3: Cesarean Delivery Patient reports tolerating PO and no problems voiding.  Small linear area of erythema and blisters at edge of old honeycomb dressing  Objective: Vital signs in last 24 hours: Temp:  [97.5 F (36.4 C)-98 F (36.7 C)] 97.5 F (36.4 C) (02/05 0643) Pulse Rate:  [62-67] 62 (02/05 0643) Resp:  [18] 18 (02/05 0643) BP: (115-120)/(79-84) 120/84 (02/05 0643) SpO2:  [99 %-100 %] 100 % (02/05 1278)  Physical Exam:  General: alert and cooperative Lochia: appropriate Uterine Fundus: firm Incision: erythema at outer aspects of where dressing was, new honeycomb clear with no erythema   Recent Labs    09/25/19 0556  HGB 9.3*  HCT 27.6*    Assessment/Plan: Status post Cesarean section. Doing well postoperatively.  Discharge home with standard precautions and return to clinic in 2 weeks for incision check. Cortisone cream to area of adhesion reaction Oliver Pila 09/27/2019, 9:45 AM

## 2019-09-28 ENCOUNTER — Encounter: Payer: Self-pay | Admitting: *Deleted

## 2019-10-07 DIAGNOSIS — M545 Low back pain: Secondary | ICD-10-CM | POA: Diagnosis not present

## 2019-10-19 ENCOUNTER — Ambulatory Visit: Payer: BC Managed Care – PPO | Attending: Internal Medicine

## 2019-10-19 DIAGNOSIS — Z23 Encounter for immunization: Secondary | ICD-10-CM | POA: Insufficient documentation

## 2019-10-19 NOTE — Progress Notes (Signed)
   Covid-19 Vaccination Clinic  Name:  Emoni Whitworth    MRN: [id number] DOB: 24-Aug-1990  10/19/2019  Ms. Hotard was observed post Covid-19 immunization for 15 minutes without incidence. She was provided with Vaccine Information Sheet and instruction to access the V-Safe system.   Ms. Spikes was instructed to call 911 with any severe reactions post vaccine: Marland Kitchen Difficulty breathing  . Swelling of your face and throat  . A fast heartbeat  . A bad rash all over your body  . Dizziness and weakness    Immunizations Administered    Name Date Dose VIS Date Route   Pfizer COVID-19 Vaccine 10/19/2019  4:49 PM 0.3 mL 08/02/2019 Intramuscular   Manufacturer: [employer], [employer]   Lot: [id number]   NDC: [id number]

## 2019-11-09 ENCOUNTER — Ambulatory Visit: Payer: BC Managed Care – PPO | Attending: Internal Medicine

## 2019-11-09 DIAGNOSIS — Z23 Encounter for immunization: Secondary | ICD-10-CM

## 2019-11-09 NOTE — Progress Notes (Signed)
   Covid-19 Vaccination Clinic  Name:  Virginia Haas    MRN: [id number] DOB: [birth year]  11/09/2019  [name] was observed post Covid-19 immunization for 15 minutes without incident. She was provided with Vaccine Information Sheet and instruction to access the V-Safe system.   [name] was instructed to call 911 with any severe reactions post vaccine: Marland Kitchen Difficulty breathing  . Swelling of face and throat  . A fast heartbeat  . A bad rash all over body  . Dizziness and weakness   Immunizations Administered    Name Date Dose VIS Date Route   Pfizer COVID-19 Vaccine 11/09/2019  1:18 PM 0.3 mL 08/02/2019 Intramuscular   Manufacturer: [employer], [employer]   Lot: [id number]   NDC: [address]-8

## 2019-11-13 DIAGNOSIS — Z3009 Encounter for other general counseling and advice on contraception: Secondary | ICD-10-CM | POA: Diagnosis not present

## 2019-11-13 DIAGNOSIS — Z1389 Encounter for screening for other disorder: Secondary | ICD-10-CM | POA: Diagnosis not present

## 2020-01-21 LAB — LIPID PANEL
Cholesterol: 213 — AB (ref 0–200)
HDL: 76 — AB (ref 35–70)
LDL Cholesterol: 123
Triglycerides: 80 (ref 40–160)

## 2020-01-21 LAB — BASIC METABOLIC PANEL: Glucose: 86

## 2020-03-10 ENCOUNTER — Encounter: Payer: Self-pay | Admitting: Family Medicine

## 2020-03-10 ENCOUNTER — Other Ambulatory Visit: Payer: Self-pay

## 2020-03-10 ENCOUNTER — Ambulatory Visit (INDEPENDENT_AMBULATORY_CARE_PROVIDER_SITE_OTHER): Payer: BC Managed Care – PPO | Admitting: Family Medicine

## 2020-03-10 ENCOUNTER — Other Ambulatory Visit: Payer: Self-pay | Admitting: Family Medicine

## 2020-03-10 VITALS — BP 108/68 | HR 80 | Temp 98.0°F | Resp 16 | Ht 66.0 in | Wt 158.8 lb

## 2020-03-10 DIAGNOSIS — E7800 Pure hypercholesterolemia, unspecified: Secondary | ICD-10-CM

## 2020-03-10 DIAGNOSIS — K601 Chronic anal fissure: Secondary | ICD-10-CM | POA: Diagnosis not present

## 2020-03-10 DIAGNOSIS — E663 Overweight: Secondary | ICD-10-CM

## 2020-03-10 DIAGNOSIS — Z7689 Persons encountering health services in other specified circumstances: Secondary | ICD-10-CM

## 2020-03-10 DIAGNOSIS — Z0000 Encounter for general adult medical examination without abnormal findings: Secondary | ICD-10-CM

## 2020-03-10 DIAGNOSIS — Z1159 Encounter for screening for other viral diseases: Secondary | ICD-10-CM

## 2020-03-10 NOTE — Patient Instructions (Addendum)
Thank you for coming to the office today.  Keep up the good work overall with your improved diet plan and goal for weight loss. I think you are doing a great job.   DUE for FASTING BLOOD WORK (no food or drink after midnight before the lab appointment, only water or coffee without cream/sugar on the morning of)  SCHEDULE "Lab Only" visit in the morning at the clinic for lab draw in 1 YEAR  - Make sure Lab Only appointment is at about 1 week before your next appointment, so that results will be available  For Lab Results, once available within 2-3 days of blood draw, you can can log in to MyChart online to view your results and a brief explanation. Also, we can discuss results at next follow-up visit.   Please schedule a Follow-up Appointment to: Return in about 1 year (around 03/10/2021) for Annual Physical.  If you have any other questions or concerns, please feel free to call the office or send a message through MyChart. You may also schedule an earlier appointment if necessary.  Additionally, you may be receiving a survey about your experience at our office within a few days to 1 week by e-mail or mail. We value your feedback.  Saralyn Pilar, DO [facility], [place]

## 2020-03-10 NOTE — Progress Notes (Signed)
Subjective:    Patient ID: Virginia Haas, female    DOB: Nov 07, 1990, 29 y.o.   MRN: [id number]  Virginia Haas is a 29 y.o. female presenting on 03/10/2020 for Establish Care (health screening needed for cholesterol check --)  Here to establish care. Patient's husband is a patient here at our office. Previously at [facility].  HPI   OB/GYN History She had history 3rd party lab test / purchased this and said that her egg reserves were low, and questioning about her menstrual cycle, after IUD had q 21 day cycles, she did not have period for 5 years while on IUD previously. First pregnancy in 2020. Delivery via S/p C-section 09/2019. Managed by [facility] She gained about 40 lbs during pregnancy. She did lose weight following delivery. She did gain about 10 lbs after post-partum initially. She states a lot with diet changed. Weight up to 175 lbs.  Had vision change during pregnancy, thought ocular migraine, ruled out MS on MRI. Family history of MS and Parkinsons in family.  Elevated LDL / Biometric Screening BMI >25 Labs reviewed from 04/2019 and 01/2020 with Cholesterol / Biometric and Glucose. - She was worried about the elevated LDL postpartum, she is nursing still 5-6 months out. - Improved diet since that time. Weight down to 155.8 lbs, has lost about 20 lbs in past 2 months approx with lifestyle changes diet and exercise. Now walking daily, no sugar added drinks, drinks mostly water - Previous history weight avg 120-130s prior to pregnancy - She is concerned about if weight LDL is related to nursing / pregnancy - Family history of Hyperlipidemia she believes with her father, may have been on rx statin medication  Chronic Anal Fissure S/p Lateral sphincterotomy in 02/2014. 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   Depression screen [facility] 2/9 03/10/2020 08/07/2018  Decreased Interest 0 1  Down, Depressed, Hopeless 0 1  PHQ - 2 Score 0 2  Altered sleeping - 1  Tired, decreased energy - 3  Change in appetite - 0  Feeling bad or failure about yourself  - 1  Trouble concentrating - 0  Moving slowly or fidgety/restless - 0  Suicidal thoughts - 0  PHQ-9 Score - 7  Difficult doing work/chores - Somewhat difficult    Past Medical History:  Diagnosis Date  . Chronic anal fissure   . Status post primary low transverse cesarean section 09/24/2019   Past Surgical History:  Procedure Laterality Date  . CESAREAN SECTION N/A 09/24/2019   Procedure: CESAREAN SECTION;  Surgeon: Edwinna Areola, DO;  Location: [facility];  Service: Obstetrics;  Laterality: N/A;  request RNFA  . lateral internal sphincteromy  02/2014   due to chronic anal fissure  . WISDOM TOOTH EXTRACTION     Social History   Socioeconomic History  . Marital status: Married    Spouse name: Lina Sayre  . Number of children: 1  . Years of education: Not on file  . Highest education level: Not on file  Occupational History  . Not on file  Tobacco Use  . Smoking status: Never Smoker  . Smokeless tobacco: Never Used  Vaping Use  .  Vaping Use: Never used  Substance and Sexual Activity  . Alcohol use: Not Currently  . Drug use: Not Currently  . Sexual activity: Yes    Birth control/protection: None  Other Topics Concern  . Not on file  Social History Narrative  . Not on file   Social Determinants of Health   Financial Resource Strain:   . Difficulty of Paying Living Expenses:   Food Insecurity:   . Worried About Programme researcher, broadcasting/film/video in the Last Year:   . Barista in the Last Year:   Transportation Needs:   . Freight forwarder (Medical):   Marland Kitchen Lack of  Transportation (Non-Medical):   Physical Activity:   . Days of Exercise per Week:   . Minutes of Exercise per Session:   Stress:   . Feeling of Stress :   Social Connections:   . Frequency of Communication with Friends and Family:   . Frequency of Social Gatherings with Friends and Family:   . Attends Religious Services:   . Active Member of Clubs or Organizations:   . Attends Banker Meetings:   Marland Kitchen Marital Status:   Intimate Partner Violence:   . Fear of Current or Ex-Partner:   . Emotionally Abused:   Marland Kitchen Physically Abused:   . Sexually Abused:    Family History  Problem Relation Age of Onset  . Parkinson's disease Father   . Hyperlipidemia Father   . Crohn's disease Maternal Grandfather   . Multiple sclerosis Maternal Grandfather   . Cancer Paternal Grandfather    Current Outpatient Medications on File Prior to Visit  Medication Sig  . Linoleic Acid-Sunflower Oil [phone] MG CAPS Take by mouth in the morning and at bedtime.  . Prenatal Vit-Fe Fumarate-FA (PRENATAL MULTIVITAMIN) TABS tablet Take 1 tablet by mouth daily at 12 noon.   No current facility-administered medications on file prior to visit.    Review of Systems Per HPI unless specifically indicated above     Objective:    BP 108/68   Pulse 80   Temp 98 F (36.7 C) (Temporal)   Resp 16   Ht 5\' 6"  (1.676 m)   Wt 158 lb 12.8 oz (72 kg)   SpO2 98%   BMI 25.63 kg/m   Wt Readings from Last 3 Encounters:  03/10/20 158 lb 12.8 oz (72 kg)  09/24/19 196 lb 4.8 oz (89 kg)  09/11/19 187 lb (84.8 kg)    Physical Exam Vitals and nursing note reviewed.  Constitutional:      General: She is not in acute distress.    Appearance: She is well-developed. She is not diaphoretic.     Comments: Well-appearing, comfortable, cooperative  HENT:     Head: Normocephalic and atraumatic.  Eyes:     General:        Right eye: No discharge.        Left eye: No discharge.     Conjunctiva/sclera: Conjunctivae  normal.  Cardiovascular:     Rate and Rhythm: Normal rate.  Pulmonary:     Effort: Pulmonary effort is normal.  Skin:    General: Skin is warm and dry.     Findings: No erythema or rash.  Neurological:     Mental Status: She is alert and oriented to person, place, and time.  Psychiatric:        Behavior: Behavior normal.     Comments: Well groomed, good eye contact, normal speech and thoughts  Results for orders placed or performed during the hospital encounter of 09/24/19  CBC  Result Value Ref Range   WBC 11.2 (H) 4.0 - 10.5 K/uL   RBC 2.89 (L) 3.87 - 5.11 MIL/uL   Hemoglobin 9.3 (L) 12.0 - 15.0 g/dL   HCT 83.1 (L) 36 - 46 %   MCV 95.5 80.0 - 100.0 fL   MCH 32.2 26.0 - 34.0 pg   MCHC 33.7 30.0 - 36.0 g/dL   RDW 51.7 61.6 - 07.3 %   Platelets 145 (L) 150 - 400 K/uL   nRBC 0.0 0.0 - 0.2 %  Collect bld for placenta donatation  Result Value Ref Range   Placenta donation bld collect Collected by Laboratory       Assessment & Plan:   Problem List Items Addressed This Visit    Chronic anal fissure    Other Visit Diagnoses    Overweight (BMI 25.0-29.9)    -  Primary   Encounter to establish care with new doctor          Review records in CareEverywhere  Due Pap smear, she will see [facility] as scheduled 04/2020. She can request records or they can fax to [name] - need upcoming pap smear on file. Anticipated to have IUD also placed at that time for contraception.  BMI >25 / Elevated LDL Reviewed prior biometric labs, and past history, fam history Discussion today on lifestyle improves Encouragement to continue success with weight Haas postpartum period now, success with diet and exercise. No concern at this time for mild elevated LDL, other results normal. Likely in postpartum phase can be affected and with the pregnancy weight gain other changes. Will repeat labs in 1 year here and do full panel  No orders of the defined types were placed in this  encounter.    Follow up plan: Return in about 1 year (around 03/10/2021) for Annual Physical.   - Ordered CMET Lipid A1c CBC for 03/04/21, add screening Hep C TSH  Saralyn Pilar, DO [facility] 03/10/2020, 3:15 PM

## 2020-04-13 DIAGNOSIS — D485 Neoplasm of uncertain behavior of skin: Secondary | ICD-10-CM | POA: Diagnosis not present

## 2020-04-13 DIAGNOSIS — D2272 Melanocytic nevi of left lower limb, including hip: Secondary | ICD-10-CM | POA: Diagnosis not present

## 2020-04-13 DIAGNOSIS — D229 Melanocytic nevi, unspecified: Secondary | ICD-10-CM | POA: Diagnosis not present

## 2020-04-13 DIAGNOSIS — D225 Melanocytic nevi of trunk: Secondary | ICD-10-CM | POA: Diagnosis not present

## 2020-04-13 DIAGNOSIS — D1801 Hemangioma of skin and subcutaneous tissue: Secondary | ICD-10-CM | POA: Diagnosis not present

## 2020-04-13 DIAGNOSIS — L578 Other skin changes due to chronic exposure to nonionizing radiation: Secondary | ICD-10-CM | POA: Diagnosis not present

## 2020-04-23 DIAGNOSIS — Z124 Encounter for screening for malignant neoplasm of cervix: Secondary | ICD-10-CM | POA: Diagnosis not present

## 2020-04-23 DIAGNOSIS — Z30430 Encounter for insertion of intrauterine contraceptive device: Secondary | ICD-10-CM | POA: Diagnosis not present

## 2020-04-24 DIAGNOSIS — Z124 Encounter for screening for malignant neoplasm of cervix: Secondary | ICD-10-CM | POA: Diagnosis not present

## 2020-04-24 LAB — HM PAP SMEAR: HM Pap smear: NEGATIVE

## 2020-05-14 IMAGING — MR MR HEAD W/O CM
12 of 13 series · 43 of 48 positions shown · non-contrast
Comparison: None.

CLINICAL DATA: Vision changes with left eye visual deficit. Defect
resolved after 30 minutes. Thirty-three weeks pregnant.

EXAM:
MRI HEAD AND ORBITS WITHOUT CONTRAST
TECHNIQUE: Multiplanar, multiecho pulse sequences of the brain and surrounding
structures were obtained without intravenous contrast. Multiplanar,
multiecho pulse sequences of the orbits and surrounding structures
were obtained including fat saturation techniques, without
intravenous contrast administration.

[Series 5: DWI · axial · 3.0mm · 0.88mm/px · z∈[-95,+36]mm · 6 of 92 slices shown (1 of 4)]
[im 1/92]
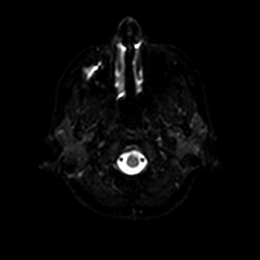
[im 19/92]
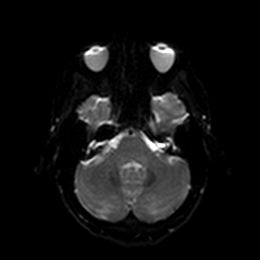
[im 37/92]
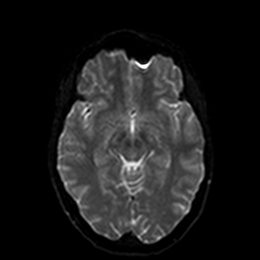
[im 55/92]
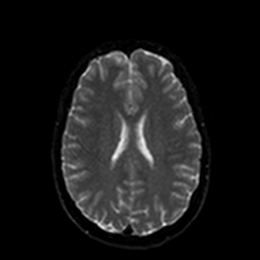
[im 73/92]
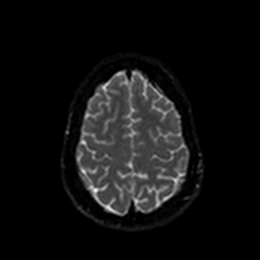
[im 92/92]
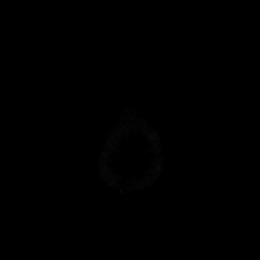

[Series 6: DWI · axial · 3.0mm · 0.88mm/px · z∈[-95,+36]mm · 3 of 46 slices shown (2 of 4)]
[im 1/46]
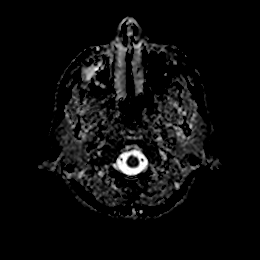
[im 23/46]
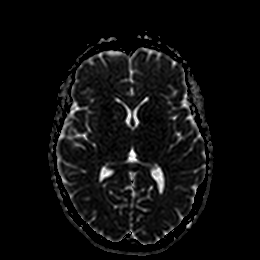
[im 46/46]
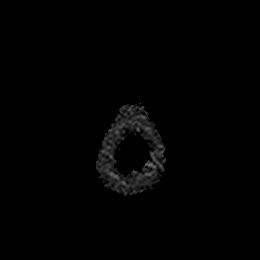

[Series 7: DWI · coronal · 4.0mm · 0.88mm/px · 6 of 72 slices shown (3 of 4)]
[im 1/72]
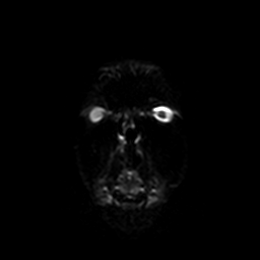
[im 15/72]
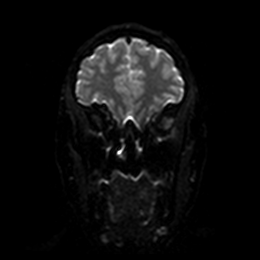
[im 29/72]
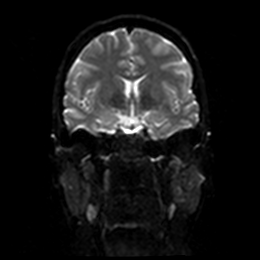
[im 43/72]
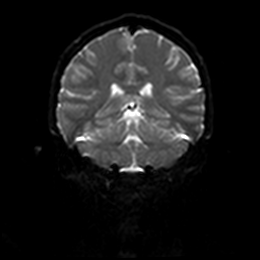
[im 57/72]
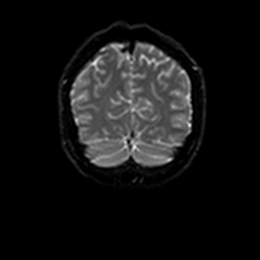
[im 72/72]
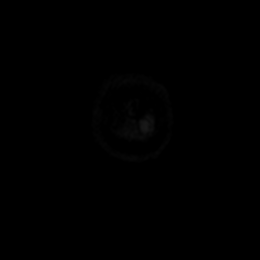

[Series 8: DWI · coronal · 4.0mm · 0.88mm/px · 3 of 36 slices shown (4 of 4)]
[im 1/36]
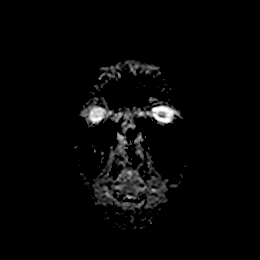
[im 18/36]
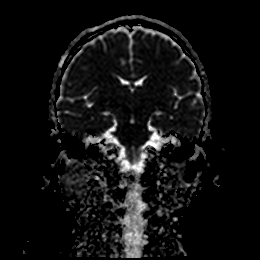
[im 36/36]
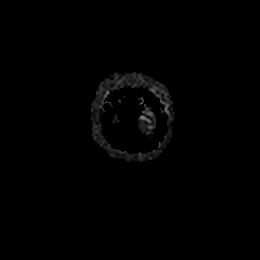

[Series 9: T1 · sagittal · 5.0mm · 0.75mm/px · 2 of 25 slices shown (1 of 2)]
[im 1/25]
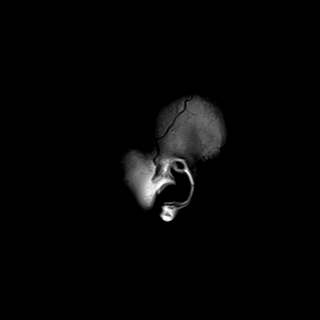
[im 25/25]
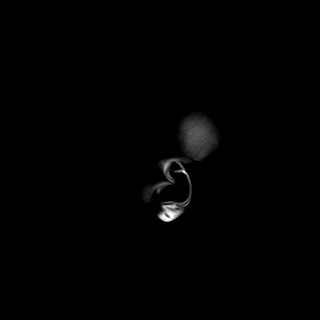

[Series 10: T2 · axial · 5.0mm · 0.72mm/px · z∈[-102,+38]mm · 2 of 25 slices shown (1 of 2)]
[im 1/25]
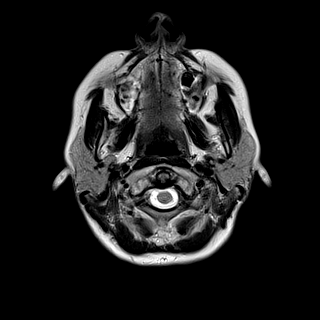
[im 25/25]
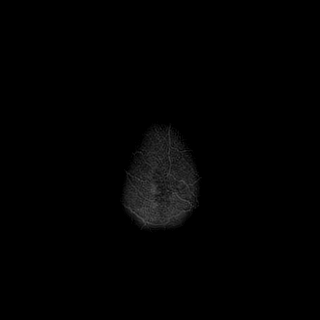

[Series 11: FLAIR · axial · 5.0mm · 0.45mm/px · z∈[-100,+40]mm · 2 of 25 slices shown]
[im 1/25]
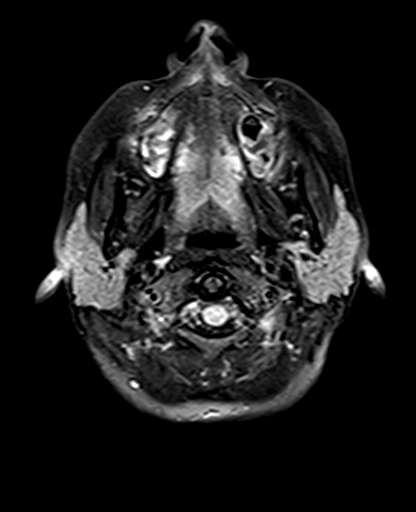
[im 25/25]
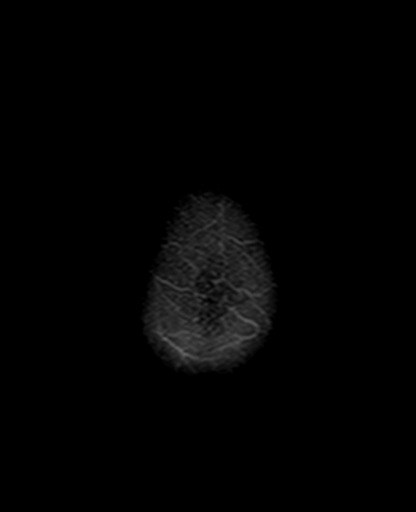

[Series 12: bSSFP · axial · 0.8mm · 0.56mm/px · z∈[-108,-45]mm · 7 of 88 slices shown]
[im 1/88]
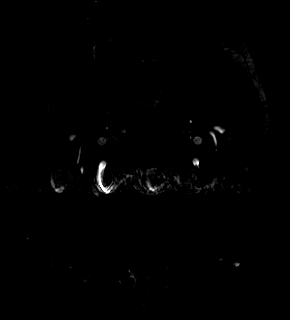
[im 15/88]
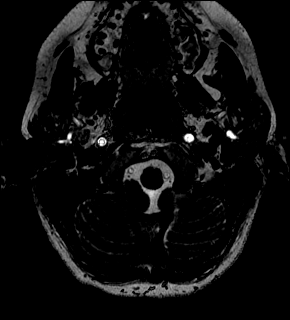
[im 30/88]
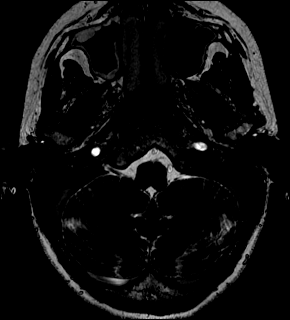
[im 44/88]
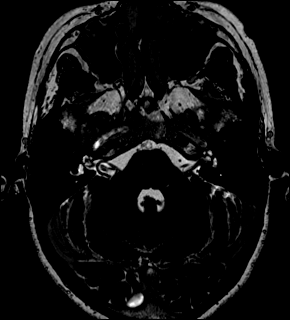
[im 59/88]
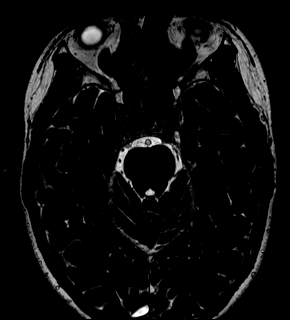
[im 73/88]
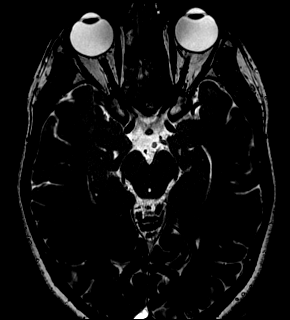
[im 88/88]
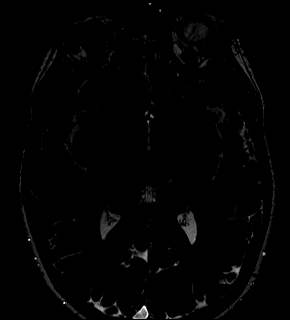

[Series 13: T2 · coronal · 5.0mm · 0.34mm/px · 2 of 29 slices shown (2 of 2)]
[im 1/29]
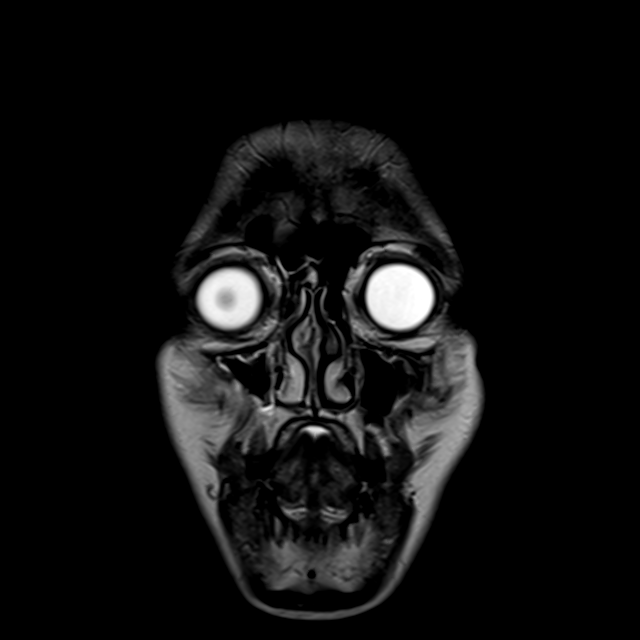
[im 29/29]
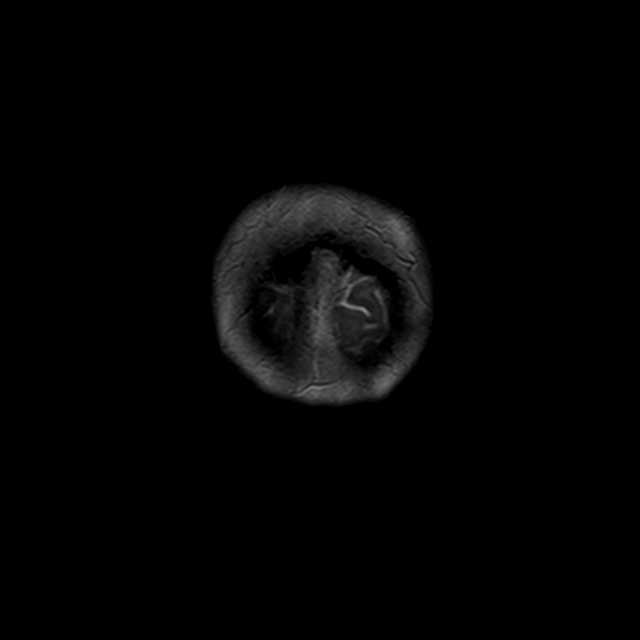

[Series 15: pha_images · axial · 3.0mm · 0.90mm/px · z∈[-124,+38]mm · 4 of 56 slices shown]
[im 1/56]
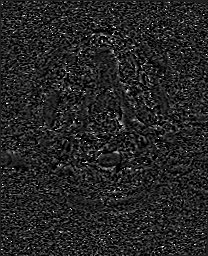
[im 19/56]
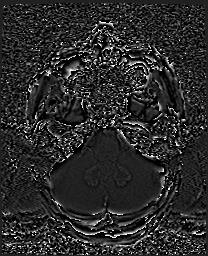
[im 37/56]
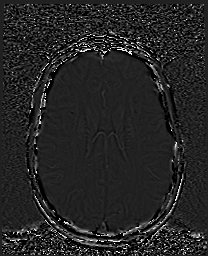
[im 56/56]
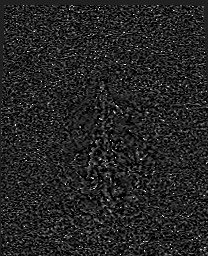

[Series 16: swi_images · axial · 3.0mm · 0.90mm/px · z∈[-124,+46]mm · 5 of 60 slices shown]
[im 1/60]
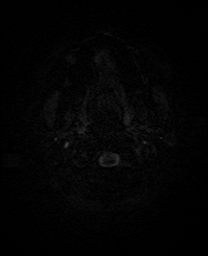
[im 15/60]
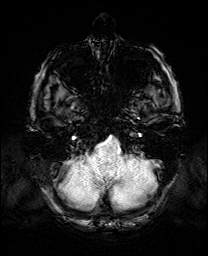
[im 30/60]
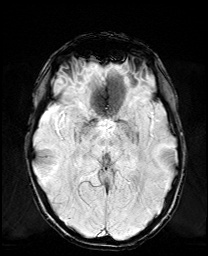
[im 45/60]
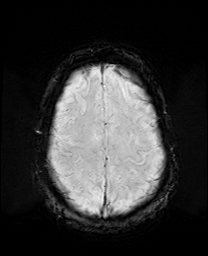
[im 60/60]
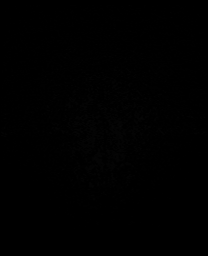

[Series 19: T1 · axial · 3.0mm · 0.37mm/px · 1 of 13 slices shown (2 of 2)]
[im 1/13]
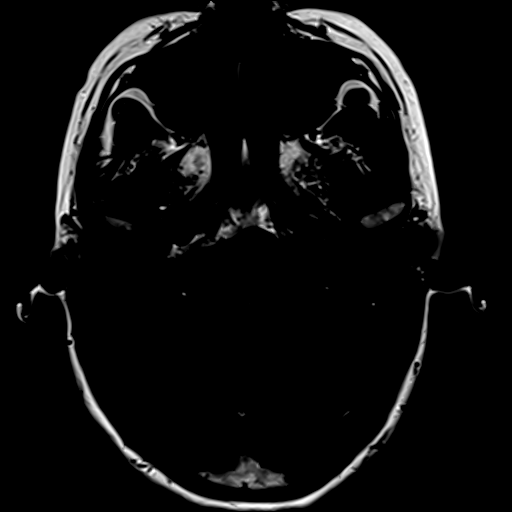

[43 of 48 positions shown; findings below may reference images not displayed]

FINDINGS: MRI HEAD FINDINGS

BRAIN: There is no acute infarct, acute hemorrhage or extra-axial
collection. Punctate focus of hyperintensity in the right cerebral
peduncle on diffusion-weighted imaging is likely due to anisotropy.
The white matter signal is normal for the patient's age. The
cerebral and cerebellar volume are age-appropriate. There is no
hydrocephalus. The midline structures are normal.

VASCULAR: The major intracranial arterial and venous sinus flow
voids are normal. Susceptibility-sensitive sequences show no chronic
microhemorrhage or superficial siderosis.

SKULL AND UPPER CERVICAL SPINE: Calvarial bone marrow signal is
normal. There is no skull base mass. The visualized upper cervical
spine and soft tissues are normal.

MRI ORBITS FINDINGS

Orbits: No traumatic or inflammatory finding. Globes, optic nerves,
orbital fat, extraocular muscles, vascular structures, and lacrimal
glands are normal.

Visualized sinuses: Mild right maxillary sinus mucosal thickening.

Soft tissues: Negative
IMPRESSION: Normal MRI of the brain and orbits.

## 2020-06-04 DIAGNOSIS — Z30431 Encounter for routine checking of intrauterine contraceptive device: Secondary | ICD-10-CM | POA: Diagnosis not present

## 2021-03-03 ENCOUNTER — Other Ambulatory Visit: Payer: Self-pay

## 2021-03-03 DIAGNOSIS — E7800 Pure hypercholesterolemia, unspecified: Secondary | ICD-10-CM

## 2021-03-03 DIAGNOSIS — E663 Overweight: Secondary | ICD-10-CM

## 2021-03-03 DIAGNOSIS — Z0000 Encounter for general adult medical examination without abnormal findings: Secondary | ICD-10-CM

## 2021-03-03 DIAGNOSIS — Z1159 Encounter for screening for other viral diseases: Secondary | ICD-10-CM

## 2021-03-04 ENCOUNTER — Other Ambulatory Visit: Payer: Self-pay

## 2021-03-04 ENCOUNTER — Other Ambulatory Visit: Payer: BC Managed Care – PPO

## 2021-03-04 DIAGNOSIS — E7800 Pure hypercholesterolemia, unspecified: Secondary | ICD-10-CM | POA: Diagnosis not present

## 2021-03-04 DIAGNOSIS — Z1159 Encounter for screening for other viral diseases: Secondary | ICD-10-CM | POA: Diagnosis not present

## 2021-03-04 DIAGNOSIS — Z0000 Encounter for general adult medical examination without abnormal findings: Secondary | ICD-10-CM | POA: Diagnosis not present

## 2021-03-05 LAB — CBC WITH DIFFERENTIAL/PLATELET
Absolute Monocytes: 427 cells/uL (ref 200–950)
Basophils Absolute: 92 cells/uL (ref 0–200)
Basophils Relative: 1.7 %
Eosinophils Absolute: 119 cells/uL (ref 15–500)
Eosinophils Relative: 2.2 %
HCT: 42.8 % (ref 35.0–45.0)
Hemoglobin: 13.9 g/dL (ref 11.7–15.5)
Lymphs Abs: 1955 cells/uL (ref 850–3900)
MCH: 30.2 pg (ref 27.0–33.0)
MCHC: 32.5 g/dL (ref 32.0–36.0)
MCV: 93 fL (ref 80.0–100.0)
MPV: 10.6 fL (ref 7.5–12.5)
Monocytes Relative: 7.9 %
Neutro Abs: 2808 cells/uL (ref 1500–7800)
Neutrophils Relative %: 52 %
Platelets: 224 10*3/uL (ref 140–400)
RBC: 4.6 10*6/uL (ref 3.80–5.10)
RDW: 12.3 % (ref 11.0–15.0)
Total Lymphocyte: 36.2 %
WBC: 5.4 10*3/uL (ref 3.8–10.8)

## 2021-03-05 LAB — COMPLETE METABOLIC PANEL WITH GFR
AG Ratio: 1.6 (calc) (ref 1.0–2.5)
ALT: 20 U/L (ref 6–29)
AST: 22 U/L (ref 10–30)
Albumin: 4.6 g/dL (ref 3.6–5.1)
Alkaline phosphatase (APISO): 56 U/L (ref 31–125)
BUN: 13 mg/dL (ref 7–25)
CO2: 23 mmol/L (ref 20–32)
Calcium: 9.7 mg/dL (ref 8.6–10.2)
Chloride: 105 mmol/L (ref 98–110)
Creat: 0.85 mg/dL (ref 0.50–0.96)
Globulin: 2.8 g/dL (calc) (ref 1.9–3.7)
Glucose, Bld: 77 mg/dL (ref 65–99)
Potassium: 4.2 mmol/L (ref 3.5–5.3)
Sodium: 138 mmol/L (ref 135–146)
Total Bilirubin: 0.9 mg/dL (ref 0.2–1.2)
Total Protein: 7.4 g/dL (ref 6.1–8.1)
eGFR: 95 mL/min/{1.73_m2} (ref >=60)

## 2021-03-05 LAB — LIPID PANEL
Cholesterol: 145 mg/dL (ref <200)
HDL: 61 mg/dL (ref >=50)
LDL Cholesterol (Calc): 69 mg/dL (calc)
Non-HDL Cholesterol (Calc): 84 mg/dL (calc) (ref <130)
Total CHOL/HDL Ratio: 2.4 (calc) (ref <5.0)
Triglycerides: 66 mg/dL (ref <150)

## 2021-03-05 LAB — HEPATITIS C ANTIBODY
Hepatitis C Ab: NONREACTIVE
SIGNAL TO CUT-OFF: 0.01 (ref <1.00)

## 2021-03-05 LAB — HEMOGLOBIN A1C
Hgb A1c MFr Bld: 5 % of total Hgb (ref <5.7)
Mean Plasma Glucose: 97 mg/dL
eAG (mmol/L): 5.4 mmol/L

## 2021-03-05 LAB — TSH: TSH: 1.85 mIU/L

## 2021-03-11 ENCOUNTER — Encounter: Payer: Self-pay | Admitting: Family Medicine

## 2021-03-11 ENCOUNTER — Other Ambulatory Visit: Payer: Self-pay

## 2021-03-11 ENCOUNTER — Ambulatory Visit (INDEPENDENT_AMBULATORY_CARE_PROVIDER_SITE_OTHER): Payer: BC Managed Care – PPO | Admitting: Family Medicine

## 2021-03-11 ENCOUNTER — Other Ambulatory Visit: Payer: Self-pay | Admitting: Family Medicine

## 2021-03-11 VITALS — BP 100/66 | HR 76 | Ht 66.0 in | Wt 140.2 lb

## 2021-03-11 DIAGNOSIS — Z6822 Body mass index (BMI) 22.0-22.9, adult: Secondary | ICD-10-CM

## 2021-03-11 DIAGNOSIS — E7800 Pure hypercholesterolemia, unspecified: Secondary | ICD-10-CM

## 2021-03-11 DIAGNOSIS — Z0000 Encounter for general adult medical examination without abnormal findings: Secondary | ICD-10-CM | POA: Diagnosis not present

## 2021-03-11 NOTE — Patient Instructions (Addendum)
Thank you for coming to the office today.  Please send [name] COVID vaccine booster info on MyChart.  We will fax [facility] for a record of the last pap smear.  Great news on your cholesterol results. Keep up the great work, we can check it once a year.  DUE for FASTING BLOOD WORK (no food or drink after midnight before the lab appointment, only water or coffee without cream/sugar on the morning of)  SCHEDULE "Lab Only" visit in the morning at the clinic for lab draw in 1 YEAR  - Make sure Lab Only appointment is at about 1 week before your next appointment, so that results will be available  For Lab Results, once available within 2-3 days of blood draw, you can can log in to MyChart online to view your results and a brief explanation. Also, we can discuss results at next follow-up visit.   Please schedule a Follow-up Appointment to: Return in about 1 year (around 03/11/2022) for 1 year for fasting lab only then 1 week later Annual Physical.  If you have any other questions or concerns, please feel free to call the office or send a message through MyChart. You may also schedule an earlier appointment if necessary.  Additionally, you may be receiving a survey about your experience at our office within a few days to 1 week by e-mail or mail. We value your feedback.  Saralyn Pilar, DO [facility], [place]

## 2021-03-11 NOTE — Progress Notes (Signed)
Subjective:    Patient ID: Virginia Haas, female    DOB: January 30, 1991, 30 y.o.   MRN: [id number]  Virginia Haas is a 30 y.o. female presenting on 03/11/2021 for Annual Exam   HPI  Here for Annual Physical and Lab Review.    Hx Elevated LDL / Biometric Screening BMI >22 Labs reviewed from 02/2021 Comparison to prior history 01/2020 and prior results during pregnancy and with infant at that time, she had poor lifestyle and history of prior cholesterol results TC >200 and LDL elevated. - Wt down to 140 lbs, prior 158 lbs 1 year ago. Back to baseline, prior to pregnancy range 130-140 range. - Diet improved healthy diet, avoiding eating out/take out, drinking mostly water. - Exercise - cycling 2 x week, Spin cardio 45 min per session, pilates yoga - Family history of Hyperlipidemia she believes with her father, may have been on rx statin medication   Chronic Anal Fissure S/p Lateral sphincterotomy in 02/2014. Had C-section done due to this prior surgery  OBGYN Followed by [place] visit 04/2020 On Kyleena IUD, hormonal. She does not have menstrual cycle on IUD.  Additional question Had bad mosquito bite recently, asked about it.  Dermatology had biopsy with dysplastic mole recent past   Health Maintenance:  COVID Vaccine 19 vaccine - updated [place] 09/2019 and 10/2019, has had booster, will update.   Updated Pap Smear per [facility], negative result, 04/2020. We will request a copy.   Depression screen [facility] 2/9 03/11/2021 03/10/2020 08/07/2018  Decreased Interest 0 0 1  Down, Depressed, Hopeless 0 0 1  PHQ - 2 Score 0 0 2  Altered sleeping - - 1  Tired, decreased energy - - 3  Change in appetite - - 0  Feeling bad or failure about yourself  - - 1  Trouble concentrating - - 0  Moving slowly or fidgety/restless - - 0  Suicidal thoughts - - 0  PHQ-9 Score - - 7  Difficult doing work/chores - - Somewhat difficult    Past Medical History:  Diagnosis Date    Chronic anal fissure    Status post primary low transverse cesarean section 09/24/2019   Past Surgical History:  Procedure Laterality Date   CESAREAN SECTION N/A 09/24/2019   Procedure: CESAREAN SECTION;  Surgeon: Sherlyn Hay, DO;  Location: [facility] LD ORS;  Service: Obstetrics;  Laterality: N/A;  request RNFA   lateral internal sphincteromy  02/2014   due to chronic anal fissure   WISDOM TOOTH EXTRACTION     Social History   Socioeconomic History   Marital status: Married    Spouse name: Su Hilt   Number of children: 1   Years of education: graduate degree   Highest education level: Master's degree (e.g., MA, MS, MEng, MEd, MSW, MBA)  Occupational History   Occupation: Warden/ranger  Tobacco Use   Smoking status: Never   Smokeless tobacco: Never  Vaping Use   Vaping Use: Never used  Substance and Sexual Activity   Alcohol use: Not Currently   Drug use: Never   Sexual activity: Yes    Birth control/protection: None  Other Topics Concern   Not on file  Social History Narrative   Not on file   Social Determinants of Health   Financial Resource Strain: Not on file  Food Insecurity: Not on file  Transportation Needs: Not on file  Physical Activity: Not on file  Stress: Not on file  Social Connections: Not on file  Intimate Partner Violence: Not on file   Family History  Problem Relation Age of Onset   Parkinson's disease Father    Hyperlipidemia Father    Crohn's disease Maternal Grandfather    Multiple sclerosis Maternal Grandfather    Cancer Paternal Grandfather    Current Outpatient Medications on File Prior to Visit  Medication Sig   levonorgestrel (KYLEENA) 19.5 MG IUD 1 each by Intrauterine route.   No current facility-administered medications on file prior to visit.    Review of Systems  Constitutional:  Negative for activity change, appetite change, chills, diaphoresis, fatigue and fever.  HENT:  Negative for congestion and hearing  loss.   Eyes:  Negative for visual disturbance.  Respiratory:  Negative for cough, chest tightness, shortness of breath and wheezing.   Cardiovascular:  Negative for chest pain, palpitations and leg swelling.  Gastrointestinal:  Negative for abdominal pain, constipation, diarrhea, nausea and vomiting.  Genitourinary:  Negative for dysuria, frequency and hematuria.  Musculoskeletal:  Negative for arthralgias and neck pain.  Skin:  Negative for rash.  Allergic/Immunologic: Negative for environmental allergies.  Neurological:  Negative for dizziness, weakness, light-headedness, numbness and headaches.  Hematological:  Negative for adenopathy.  Psychiatric/Behavioral:  Negative for behavioral problems, dysphoric mood and sleep disturbance.   Per HPI unless specifically indicated above      Objective:    BP 100/66   Pulse 76   Ht _0  (1.676 m)   Wt 140 lb 3.2 oz (63.6 kg)   SpO2 99%   BMI 22.63 kg/m   Wt Readings from Last 3 Encounters:  03/11/21 140 lb 3.2 oz (63.6 kg)  03/10/20 158 lb 12.8 oz (72 kg)  09/24/19 196 lb 4.8 oz (89 kg)    Physical Exam Vitals and nursing note reviewed.  Constitutional:      General: She is not in acute distress.    Appearance: She is well-developed. She is not diaphoretic.     Comments: Well-appearing, comfortable, cooperative  HENT:     Head: Normocephalic and atraumatic.  Eyes:     General:        Right eye: No discharge.        Left eye: No discharge.     Conjunctiva/sclera: Conjunctivae normal.     Pupils: Pupils are equal, round, and reactive to light.  Neck:     Thyroid: No thyromegaly.  Cardiovascular:     Rate and Rhythm: Normal rate and regular rhythm.     Pulses: Normal pulses.     Heart sounds: Normal heart sounds. No murmur heard. Pulmonary:     Effort: Pulmonary effort is normal. No respiratory distress.     Breath sounds: Normal breath sounds. No wheezing or rales.  Abdominal:     General: Bowel sounds are normal. There  is no distension.     Palpations: Abdomen is soft. There is no mass.     Tenderness: There is no abdominal tenderness.  Musculoskeletal:        General: No tenderness. Normal range of motion.     Cervical back: Normal range of motion and neck supple.     Right lower leg: No edema.     Left lower leg: No edema.     Comments: Upper / Lower Extremities: - Normal muscle tone, strength bilateral upper extremities 5/5, lower extremities 5/5  Lymphadenopathy:     Cervical: No cervical adenopathy.  Skin:    General: Skin is warm and dry.     Findings: No  erythema or rash.  Neurological:     Mental Status: She is alert and oriented to person, place, and time.     Comments: Distal sensation intact to light touch all extremities  Psychiatric:        Mood and Affect: Mood normal.        Behavior: Behavior normal.        Thought Content: Thought content normal.     Comments: Well groomed, good eye contact, normal speech and thoughts     Results for orders placed or performed in visit on 03/03/21  Hepatitis C antibody  Result Value Ref Range   Hepatitis C Ab NON-REACTIVE NON-REACTIVE   SIGNAL TO CUT-OFF 0.01 <1.00  TSH  Result Value Ref Range   TSH 1.85 mIU/L  Lipid panel  Result Value Ref Range   Cholesterol 145 <200 mg/dL   HDL 61 > OR = 50 mg/dL   Triglycerides 66 <150 mg/dL   LDL Cholesterol (Calc) 69 mg/dL (calc)   Total CHOL/HDL Ratio 2.4 <5.0 (calc)   Non-HDL Cholesterol (Calc) 84 <130 mg/dL (calc)  COMPLETE METABOLIC PANEL WITH GFR  Result Value Ref Range   Glucose, Bld 77 65 - 99 mg/dL   BUN 13 7 - 25 mg/dL   Creat 0.85 0.50 - 0.96 mg/dL   eGFR 95 > OR = 60 mL/min/1.34m   BUN/Creatinine Ratio NOT APPLICABLE 6 - 22 (calc)   Sodium 138 135 - 146 mmol/L   Potassium 4.2 3.5 - 5.3 mmol/L   Chloride 105 98 - 110 mmol/L   CO2 23 20 - 32 mmol/L   Calcium 9.7 8.6 - 10.2 mg/dL   Total Protein 7.4 6.1 - 8.1 g/dL   Albumin 4.6 3.6 - 5.1 g/dL   Globulin 2.8 1.9 - 3.7 g/dL  (calc)   AG Ratio 1.6 1.0 - 2.5 (calc)   Total Bilirubin 0.9 0.2 - 1.2 mg/dL   Alkaline phosphatase (APISO) 56 31 - 125 U/L   AST 22 10 - 30 U/L   ALT 20 6 - 29 U/L  CBC with Differential/Platelet  Result Value Ref Range   WBC 5.4 3.8 - 10.8 Thousand/uL   RBC 4.60 3.80 - 5.10 Million/uL   Hemoglobin 13.9 11.7 - 15.5 g/dL   HCT 42.8 35.0 - 45.0 %   MCV 93.0 80.0 - 100.0 fL   MCH 30.2 27.0 - 33.0 pg   MCHC 32.5 32.0 - 36.0 g/dL   RDW 12.3 11.0 - 15.0 %   Platelets 224 140 - 400 Thousand/uL   MPV 10.6 7.5 - 12.5 fL   Neutro Abs 2,808 1,500 - 7,800 cells/uL   Lymphs Abs 1,955 850 - 3,900 cells/uL   Absolute Monocytes 427 200 - 950 cells/uL   Eosinophils Absolute 119 15 - 500 cells/uL   Basophils Absolute 92 0 - 200 cells/uL   Neutrophils Relative % 52 %   Total Lymphocyte 36.2 %   Monocytes Relative 7.9 %   Eosinophils Relative 2.2 %   Basophils Relative 1.7 %  Hemoglobin A1c  Result Value Ref Range   Hgb A1c MFr Bld 5.0 <5.7 % of total Hgb   Mean Plasma Glucose 97 mg/dL   eAG (mmol/L) 5.4 mmol/L      Assessment & Plan:   Problem List Items Addressed This Visit   None Visit Diagnoses     Annual physical exam    -  Primary   BMI 22.0-22.9, adult  Updated Health Maintenance information Request copy of [facility] Pap [facility] 04/[facility], negative. Next due 3 years. Reviewed recent lab results with patient Updated COVID Vaccine booster on chart. Labs improved. Lipids back to normal range, previously from pregnancy and poor diet following, has improved. Encouraged improvement to lifestyle with diet and exercise Goal of maintain healthy weight.   No orders of the defined types were placed in this encounter.     Follow up plan: Return in about 1 year (around 03/11/2022) for 1 year for fasting lab only then 1 week later Annual Physical.  Labs 1 year, include TSH, all labs.  Nobie Putnam, [facility] 03/11/2021, 8:36 AM

## 2021-04-01 ENCOUNTER — Encounter: Payer: Self-pay | Admitting: Family Medicine

## 2021-04-04 NOTE — Telephone Encounter (Cosign Needed)
I believe this is your patient

## 2021-11-04 ENCOUNTER — Encounter: Payer: Self-pay | Admitting: Family Medicine

## 2022-03-10 ENCOUNTER — Other Ambulatory Visit: Payer: BC Managed Care – PPO

## 2022-03-10 DIAGNOSIS — E7800 Pure hypercholesterolemia, unspecified: Secondary | ICD-10-CM | POA: Diagnosis not present

## 2022-03-10 DIAGNOSIS — Z0000 Encounter for general adult medical examination without abnormal findings: Secondary | ICD-10-CM

## 2022-03-11 LAB — CBC WITH DIFFERENTIAL/PLATELET
Absolute Monocytes: 582 cells/uL (ref 200–950)
Basophils Absolute: 73 cells/uL (ref 0–200)
Basophils Relative: 1.3 %
Eosinophils Absolute: 241 cells/uL (ref 15–500)
Eosinophils Relative: 4.3 %
HCT: 43.9 % (ref 35.0–45.0)
Hemoglobin: 14.5 g/dL (ref 11.7–15.5)
Lymphs Abs: 1876 cells/uL (ref 850–3900)
MCH: 31.1 pg (ref 27.0–33.0)
MCHC: 33 g/dL (ref 32.0–36.0)
MCV: 94.2 fL (ref 80.0–100.0)
MPV: 10.3 fL (ref 7.5–12.5)
Monocytes Relative: 10.4 %
Neutro Abs: 2828 cells/uL (ref 1500–7800)
Neutrophils Relative %: 50.5 %
Platelets: 229 10*3/uL (ref 140–400)
RBC: 4.66 10*6/uL (ref 3.80–5.10)
RDW: 12.4 % (ref 11.0–15.0)
Total Lymphocyte: 33.5 %
WBC: 5.6 10*3/uL (ref 3.8–10.8)

## 2022-03-11 LAB — COMPLETE METABOLIC PANEL WITH GFR
AG Ratio: 1.9 (calc) (ref 1.0–2.5)
ALT: 21 U/L (ref 6–29)
AST: 20 U/L (ref 10–30)
Albumin: 4.7 g/dL (ref 3.6–5.1)
Alkaline phosphatase (APISO): 54 U/L (ref 31–125)
BUN: 10 mg/dL (ref 7–25)
CO2: 24 mmol/L (ref 20–32)
Calcium: 9.6 mg/dL (ref 8.6–10.2)
Chloride: 104 mmol/L (ref 98–110)
Creat: 0.77 mg/dL (ref 0.50–0.97)
Globulin: 2.5 g/dL (calc) (ref 1.9–3.7)
Glucose, Bld: 91 mg/dL (ref 65–99)
Potassium: 4.3 mmol/L (ref 3.5–5.3)
Sodium: 137 mmol/L (ref 135–146)
Total Bilirubin: 0.7 mg/dL (ref 0.2–1.2)
Total Protein: 7.2 g/dL (ref 6.1–8.1)
eGFR: 106 mL/min/{1.73_m2} (ref >=60)

## 2022-03-11 LAB — LIPID PANEL
Cholesterol: 135 mg/dL (ref <200)
HDL: 63 mg/dL (ref >=50)
LDL Cholesterol (Calc): 54 mg/dL (calc)
Non-HDL Cholesterol (Calc): 72 mg/dL (calc) (ref <130)
Total CHOL/HDL Ratio: 2.1 (calc) (ref <5.0)
Triglycerides: 94 mg/dL (ref <150)

## 2022-03-11 LAB — ADVANCED WRITTEN NOTIFICATION (AWN) TEST REFUSAL: AWN TEST REFUSED: 16802

## 2022-03-11 LAB — TSH: TSH: 2.73 mIU/L

## 2022-03-17 ENCOUNTER — Ambulatory Visit (INDEPENDENT_AMBULATORY_CARE_PROVIDER_SITE_OTHER): Payer: BC Managed Care – PPO | Admitting: Family Medicine

## 2022-03-17 ENCOUNTER — Encounter: Payer: Self-pay | Admitting: Family Medicine

## 2022-03-17 ENCOUNTER — Other Ambulatory Visit: Payer: Self-pay | Admitting: Family Medicine

## 2022-03-17 VITALS — BP 98/67 | HR 69 | Ht 66.0 in | Wt 142.4 lb

## 2022-03-17 DIAGNOSIS — Z862 Personal history of diseases of the blood and blood-forming organs and certain disorders involving the immune mechanism: Secondary | ICD-10-CM

## 2022-03-17 DIAGNOSIS — Z0000 Encounter for general adult medical examination without abnormal findings: Secondary | ICD-10-CM | POA: Diagnosis not present

## 2022-03-17 DIAGNOSIS — E7800 Pure hypercholesterolemia, unspecified: Secondary | ICD-10-CM

## 2022-03-17 NOTE — Patient Instructions (Addendum)
Thank you for coming to the office today.  Request copy of the official Pap Smear Report from your OBGYN.  Fax [phone]  Keep an eye on the sleep / fatigue / tired. We can talk more down the road, and consider other lab testing, full thyroid panel / T4 and also Vitamin D and B12 testing.   Sleep Hygiene Recommendations to promote healthy sleep in all patients, especially if symptoms of insomnia are worsening. Due to the nature of sleep rhythms, if your body gets "out of rhythm", it may take some time before your sleep cycle can be "reset".  Please try to follow as many of the following tips as you can, usually there are only a few of these are the primary cause of the problem.  ?To reset your sleep rhythm, go to bed and get up at the same time every day ?Sleep only long enough to feel rested and then get out of bed ?Do not try to force yourself to sleep. If you can't sleep, get out of bed and try again later. ?Avoid naps during the day, unless excessively tired. The more sleeping during the day, then the less sleep your body needs at night.  ?Have coffee, tea, and other foods that have caffeine only in the morning ?Exercise several days a week, but not right before bed ?If you drink alcohol, prefer to have appropriate drink with one meal, but prefer to avoid alcohol in the evening, and bedtime ?If you smoke, avoid smoking, especially in the evening  ?Avoid watching TV or looking at phones, computers, or reading devices ("e-books") that give off light at least 30 minutes before bed. This artificial light sends "awake signals" to your brain and can make it harder to fall asleep. ?Make your bedroom a comfortable place where it is easy to fall asleep: Put up shades or special blackout curtains to block light from outside. Use a white noise machine to block noise. Keep the temperature cool. ?Try your best to solve or at least address your problems before you go to bed ?Use relaxation  techniques to manage stress. Ask your health care provider to suggest some techniques that may work well for you. These may include: Breathing exercises. Routines to release muscle tension. Visualizing peaceful scenes.    DUE for FASTING BLOOD WORK (no food or drink after midnight before the lab appointment, only water or coffee without cream/sugar on the morning of)  SCHEDULE "Lab Only" visit in the morning at the clinic for lab draw in 1 YEAR  - Make sure Lab Only appointment is at about 1 week before your next appointment, so that results will be available  For Lab Results, once available within 2-3 days of blood draw, you can can log in to MyChart online to view your results and a brief explanation. Also, we can discuss results at next follow-up visit.   Please schedule a Follow-up Appointment to: Return in about 1 year (around 03/18/2023) for 1 year fasting lab only then 1 week later Annual Physical.  If you have any other questions or concerns, please feel free to call the office or send a message through MyChart. You may also schedule an earlier appointment if necessary.  Additionally, you may be receiving a survey about your experience at our office within a few days to 1 week by e-mail or mail. We value your feedback.  Saralyn Pilar, DO [facility], [place]

## 2022-03-17 NOTE — Progress Notes (Signed)
Subjective:    Patient ID: Virginia Haas, female    DOB: [birth year], 31 y.o.   MRN: [id number]  Virginia Haas is a 31 y.o. female presenting on 03/17/2022 for Annual Exam   HPI  Here for Annual Physical and Lab Review.    Elevated LDL / Biometric Screening BMI >22 Lab results reviewed. Significant improvement in cholesterol panel now from previous 2 yr ago elevated during pregnancy Now normalized LDL and all lipids Weight has stabilized at 142 lbs. - Diet improved healthy diet, avoiding eating out/take out, drinking mostly water. - Exercise - cycling, spin cardio, running, training for 5k race - Family history of Hyperlipidemia she believes with her father, may have been on rx statin medication   Chronic Anal Fissure S/p Lateral sphincterotomy in 02/2014. Had C-section done due to this prior surgery   OBGYN Followed by Virginia Haas Last Pap 2021. She will request copy. Not due for repeat but has apt this year. On Tok IUD, hormonal. She does not have menstrual cycle on IUD.   Additional question  Fatigue / Tired increased Notes her 2 yr daughter not sleeping as long overnight, usually 9 hours, daughter going to bed later now. She will stay up to read and have free time so less sleep. Also takes a 20 min nap during day  Dermatology had biopsy with dysplastic mole recent past   Health Maintenance:   COVID Vaccine 19 vaccine - updated    Updated Pap Smear per [facility], negative result, 04/2020. We will request a copy.      03/17/2022    8:09 AM 03/11/2021    8:48 AM 03/10/2020    3:10 PM  Depression screen PHQ 2/9  Decreased Interest 0 0 0  Down, Depressed, Hopeless 0 0 0  PHQ - 2 Score 0 0 0  Altered sleeping 1    Tired, decreased energy 2    Change in appetite 0    Feeling bad or failure about yourself  0    Trouble concentrating 0    Moving slowly or fidgety/restless 0    Suicidal thoughts 0    PHQ-9 Score 3    Difficult doing work/chores  Somewhat difficult        03/17/2022    8:10 AM  GAD 7 : Generalized Anxiety Score  Nervous, Anxious, on Edge 1  Control/stop worrying 1  Worry too much - different things 2  Trouble relaxing 2  Restless 0  Easily annoyed or irritable 2  Afraid - awful might happen 2  Total GAD 7 Score 10  Anxiety Difficulty Somewhat difficult      Past Medical History:  Diagnosis Date   Chronic anal fissure    Status post primary low transverse cesarean section 09/24/2019   Past Surgical History:  Procedure Laterality Date   CESAREAN SECTION N/A 09/24/2019   Procedure: CESAREAN SECTION;  Surgeon: Sherlyn Hay, DO;  Location: [facility];  Service: Obstetrics;  Laterality: N/A;  request RNFA   lateral internal sphincteromy  02/2014   due to chronic anal fissure   WISDOM TOOTH EXTRACTION     Social History   Socioeconomic History   Marital status: Married    Spouse name: Su Hilt   Number of children: 1   Years of education: graduate degree   Highest education level: Master's degree (e.g., MA, MS, MEng, MEd, MSW, MBA)  Occupational History   Occupation: Warden/ranger  Tobacco Use   Smoking status: Never  Smokeless tobacco: Never  Vaping Use   Vaping Use: Never used  Substance and Sexual Activity   Alcohol use: Not Currently   Drug use: Never   Sexual activity: Yes    Birth control/protection: None  Other Topics Concern   Not on file  Social History Narrative   Not on file   Social Determinants of Health   Financial Resource Strain: Not on file  Food Insecurity: Not on file  Transportation Needs: Not on file  Physical Activity: Not on file  Stress: Not on file  Social Connections: Not on file  Intimate Partner Violence: Not on file   Family History  Problem Relation Age of Onset   Parkinson's disease Father    Hyperlipidemia Father    Crohn's disease Maternal Grandfather    Multiple sclerosis Maternal Grandfather    Cancer Paternal Grandfather     Current Outpatient Medications on File Prior to Visit  Medication Sig   levonorgestrel (KYLEENA) 19.5 MG IUD 1 each by Intrauterine route.   No current facility-administered medications on file prior to visit.    Review of Systems  Constitutional:  Negative for activity change, appetite change, chills, diaphoresis, fatigue and fever.  HENT:  Negative for congestion and hearing loss.   Eyes:  Negative for visual disturbance.  Respiratory:  Negative for cough, chest tightness, shortness of breath and wheezing.   Cardiovascular:  Negative for chest pain, palpitations and leg swelling.  Gastrointestinal:  Negative for abdominal pain, constipation, diarrhea, nausea and vomiting.  Genitourinary:  Negative for dysuria, frequency and hematuria.  Musculoskeletal:  Negative for arthralgias and neck pain.  Skin:  Negative for rash.  Neurological:  Negative for dizziness, weakness, light-headedness, numbness and headaches.  Hematological:  Negative for adenopathy.  Psychiatric/Behavioral:  Negative for behavioral problems, dysphoric mood and sleep disturbance.    Per HPI unless specifically indicated above      Objective:    BP 98/67   Pulse 69   Ht $R'5\' 6"'ZC$  (1.676 m)   Wt 142 lb 6.4 oz (64.6 kg)   SpO2 99%   BMI 22.98 kg/m   Wt Readings from Last 3 Encounters:  03/17/22 142 lb 6.4 oz (64.6 kg)  03/11/21 140 lb 3.2 oz (63.6 kg)  03/10/20 158 lb 12.8 oz (72 kg)    Physical Exam Vitals and nursing note reviewed.  Constitutional:      General: She is not in acute distress.    Appearance: She is well-developed. She is not diaphoretic.     Comments: Well-appearing, comfortable, cooperative  HENT:     Head: Normocephalic and atraumatic.     Right Ear: Tympanic membrane, ear canal and external ear normal. There is no impacted cerumen.     Left Ear: Tympanic membrane, ear canal and external ear normal. There is no impacted cerumen.  Eyes:     General:        Right eye: No discharge.         Left eye: No discharge.     Conjunctiva/sclera: Conjunctivae normal.     Pupils: Pupils are equal, round, and reactive to light.  Neck:     Thyroid: No thyromegaly.  Cardiovascular:     Rate and Rhythm: Normal rate and regular rhythm.     Pulses: Normal pulses.     Heart sounds: Normal heart sounds. No murmur heard. Pulmonary:     Effort: Pulmonary effort is normal. No respiratory distress.     Breath sounds: Normal breath sounds. No wheezing  or rales.  Abdominal:     General: Bowel sounds are normal. There is no distension.     Palpations: Abdomen is soft. There is no mass.     Tenderness: There is no abdominal tenderness.  Musculoskeletal:        General: No tenderness. Normal range of motion.     Cervical back: Normal range of motion and neck supple.     Right lower leg: No edema.     Left lower leg: No edema.     Comments: Upper / Lower Extremities: - Normal muscle tone, strength bilateral upper extremities 5/5, lower extremities 5/5  Lymphadenopathy:     Cervical: No cervical adenopathy.  Skin:    General: Skin is warm and dry.     Findings: No erythema or rash.  Neurological:     Mental Status: She is alert and oriented to person, place, and time.     Comments: Distal sensation intact to light touch all extremities  Psychiatric:        Mood and Affect: Mood normal.        Behavior: Behavior normal.        Thought Content: Thought content normal.     Comments: Well groomed, good eye contact, normal speech and thoughts      Results for orders placed or performed in visit on 03/10/22  TSH  Result Value Ref Range   TSH 2.73 mIU/L  CBC with Differential/Platelet  Result Value Ref Range   WBC 5.6 3.8 - 10.8 Thousand/uL   RBC 4.66 3.80 - 5.10 Million/uL   Hemoglobin 14.5 11.7 - 15.5 g/dL   HCT 43.9 35.0 - 45.0 %   MCV 94.2 80.0 - 100.0 fL   MCH 31.1 27.0 - 33.0 pg   MCHC 33.0 32.0 - 36.0 g/dL   RDW 12.4 11.0 - 15.0 %   Platelets 229 140 - 400 Thousand/uL    MPV 10.3 7.5 - 12.5 fL   Neutro Abs 2,828 1,500 - 7,800 cells/uL   Lymphs Abs 1,876 850 - 3,900 cells/uL   Absolute Monocytes 582 200 - 950 cells/uL   Eosinophils Absolute 241 15 - 500 cells/uL   Basophils Absolute 73 0 - 200 cells/uL   Neutrophils Relative % 50.5 %   Total Lymphocyte 33.5 %   Monocytes Relative 10.4 %   Eosinophils Relative 4.3 %   Basophils Relative 1.3 %  Lipid panel  Result Value Ref Range   Cholesterol 135 <200 mg/dL   HDL 63 > OR = 50 mg/dL   Triglycerides 94 <150 mg/dL   LDL Cholesterol (Calc) 54 mg/dL (calc)   Total CHOL/HDL Ratio 2.1 <5.0 (calc)   Non-HDL Cholesterol (Calc) 72 <130 mg/dL (calc)  COMPLETE METABOLIC PANEL WITH GFR  Result Value Ref Range   Glucose, Bld 91 65 - 99 mg/dL   BUN 10 7 - 25 mg/dL   Creat 0.77 0.50 - 0.97 mg/dL   eGFR 106 > OR = 60 mL/min/1.29m2   BUN/Creatinine Ratio NOT APPLICABLE 6 - 22 (calc)   Sodium 137 135 - 146 mmol/L   Potassium 4.3 3.5 - 5.3 mmol/L   Chloride 104 98 - 110 mmol/L   CO2 24 20 - 32 mmol/L   Calcium 9.6 8.6 - 10.2 mg/dL   Total Protein 7.2 6.1 - 8.1 g/dL   Albumin 4.7 3.6 - 5.1 g/dL   Globulin 2.5 1.9 - 3.7 g/dL (calc)   AG Ratio 1.9 1.0 - 2.5 (calc)   Total Bilirubin 0.7  0.2 - 1.2 mg/dL   Alkaline phosphatase (APISO) 54 31 - 125 U/L   AST 20 10 - 30 U/L   ALT 21 6 - 29 U/L  Advanced Written Notification [facility]) Test Refusal  Result Value Ref Range   RAM1     AWN TEST REFUSED 16802       Assessment & Plan:   Problem List Items Addressed This Visit   None Visit Diagnoses     Annual physical exam    -  Primary       Updated Health Maintenance information Reviewed recent lab results with patient Improved lab results Encouraged improvement to lifestyle with diet and exercise Continue running cardio exercise Goal of weight loss  Requested copy of last Pap Smear report from OBGYN, she will submit this request at next visit.  Discussed some fatigue / tiredness symptoms. Most likely daily  routine / sleep changes Labs reassuring No anemia identified Thyroid normal Will consider return visit for further evaluation and diagnostic work up with Thyroid full panel, Vitamin testing D, B12 if indicated.  No orders of the defined types were placed in this encounter.     Follow up plan: Return in about 1 year (around 03/18/2023) for 1 year fasting lab only then 1 week later Annual Physical.  Future labs ordered 02/2023. CMET CBC Lipid + TSH Panel.  Nobie Putnam, [facility] 03/17/2022, 8:13 AM

## 2022-04-07 DIAGNOSIS — Z130 Encounter for screening for diseases of the blood and blood-forming organs and certain disorders involving the immune mechanism: Secondary | ICD-10-CM | POA: Diagnosis not present

## 2022-04-07 DIAGNOSIS — Z01419 Encounter for gynecological examination (general) (routine) without abnormal findings: Secondary | ICD-10-CM | POA: Diagnosis not present

## 2022-04-11 ENCOUNTER — Encounter: Payer: Self-pay | Admitting: Family Medicine

## 2022-08-16 DIAGNOSIS — Z23 Encounter for immunization: Secondary | ICD-10-CM | POA: Diagnosis not present

## 2022-09-02 ENCOUNTER — Encounter: Payer: Self-pay | Admitting: Family Medicine

## 2023-01-19 ENCOUNTER — Telehealth: Payer: Self-pay

## 2023-01-19 DIAGNOSIS — E7800 Pure hypercholesterolemia, unspecified: Secondary | ICD-10-CM

## 2023-01-19 DIAGNOSIS — Z862 Personal history of diseases of the blood and blood-forming organs and certain disorders involving the immune mechanism: Secondary | ICD-10-CM

## 2023-01-19 DIAGNOSIS — Z0000 Encounter for general adult medical examination without abnormal findings: Secondary | ICD-10-CM

## 2023-01-19 NOTE — Telephone Encounter (Signed)
Copied from CRM [id number]. Topic: General - Inquiry >> Jan 19, 2023  8:27 AM De Blanch wrote: Reason for CRM: Pt is requesting lab orders for upcoming CPE on 08/13.  Please advise.

## 2023-01-19 NOTE — Telephone Encounter (Signed)
Patient aware of orders and appointment scheduled.

## 2023-01-19 NOTE — Telephone Encounter (Signed)
Lab orders are in.  She may be scheduled for fasting lab only visit prior to 04/04/23  [place]ia Pilar, DO [facility] 01/19/2023, 12:20 PM

## 2023-03-27 ENCOUNTER — Other Ambulatory Visit: Payer: BC Managed Care – PPO

## 2023-03-27 DIAGNOSIS — E7800 Pure hypercholesterolemia, unspecified: Secondary | ICD-10-CM

## 2023-03-27 DIAGNOSIS — Z862 Personal history of diseases of the blood and blood-forming organs and certain disorders involving the immune mechanism: Secondary | ICD-10-CM

## 2023-03-27 DIAGNOSIS — Z0000 Encounter for general adult medical examination without abnormal findings: Secondary | ICD-10-CM | POA: Diagnosis not present

## 2023-03-28 ENCOUNTER — Other Ambulatory Visit: Payer: BC Managed Care – PPO

## 2023-04-04 ENCOUNTER — Ambulatory Visit (INDEPENDENT_AMBULATORY_CARE_PROVIDER_SITE_OTHER): Payer: BC Managed Care – PPO | Admitting: Family Medicine

## 2023-04-04 ENCOUNTER — Encounter: Payer: Self-pay | Admitting: Family Medicine

## 2023-04-04 VITALS — BP 128/72 | HR 63 | Ht 66.0 in | Wt 143.0 lb

## 2023-04-04 DIAGNOSIS — Z0000 Encounter for general adult medical examination without abnormal findings: Secondary | ICD-10-CM | POA: Diagnosis not present

## 2023-04-04 NOTE — Patient Instructions (Addendum)
Thank you for coming to the office today.  Overall the Thyroid panel is normal range. We can review it again in future, as discussed please chat with your OBGYN with regards to pregnancy impact on Thyroid.  Prefer [facility]   [address] [place], [place] [address] Hours: 8AM-5PM Phone: [phone]  DUE for FASTING BLOOD WORK (no food or drink after midnight before the lab appointment, only water or coffee without cream/sugar on the morning of)  SCHEDULE "Lab Only" visit in the morning at the clinic for lab draw in 1 YEAR  - Make sure Lab Only appointment is at about 1 week before your next appointment, so that results will be available  For Lab Results, once available within 2-3 days of blood draw, you can can log in to MyChart online to view your results and a brief explanation. Also, we can discuss results at next follow-up visit.    Please schedule a Follow-up Appointment to: Return in about 1 year (around 04/03/2024) for 1 year fasting lab only then 1 week later Annual Physical.  If you have any other questions or concerns, please feel free to call the office or send a message through MyChart. You may also schedule an earlier appointment if necessary.  Additionally, you may be receiving a survey about your experience at our office within a few days to 1 week by e-mail or mail. We value your feedback.  Saralyn Pilar, DO [facility], [place]

## 2023-04-04 NOTE — Progress Notes (Unsigned)
Subjective:    Patient ID: Virginia Haas, female    DOB: [birth year], 32 y.o.   MRN: [id number]  Virginia Haas is a 32 y.o. female presenting on 04/04/2023 for Annual Exam   HPI  Here for Annual Physical and Lab Review.    Elevated LDL / Biometric Screening BMI >23 Lab results reviewed. Significant improvement in cholesterol panel now from previous 2 yr ago elevated during pregnancy Now normalized LDL and all lipids Weight has stabilized at 142 lbs. - Diet improved healthy diet, avoiding eating out/take out, drinking mostly water. - Exercise - cycling, spin cardio, running, training for 5k race - Family history of Hyperlipidemia she believes with her father, may have been on rx statin medication  ***TSH discussion    Chronic Anal Fissure S/p Lateral sphincterotomy in 02/2014. Had C-section done due to this prior surgery   OBGYN Followed by Willodean Rosenthal Last Pap 2021. She will request copy. Not due for repeat but has apt this year. On Kyleena IUD, hormonal. She does not have menstrual cycle on IUD.   Additional question   Fatigue / Tired increased Notes her 2 yr daughter not sleeping as long overnight, usually 9 hours, daughter going to bed later now. She will stay up to read and have free time so less sleep. Also takes a 20 min nap during day   Dermatology had biopsy with dysplastic mole recent past    Health Maintenance:   She did have COVID earlier this year.  Due for Flu Shot   Updated Pap Smear per [facility], negative result, 04/2020. Next due 04/2023, has apt scheduled.     04/04/2023    8:41 AM 03/17/2022    8:09 AM 03/11/2021    8:48 AM  Depression screen PHQ 2/9  Decreased Interest 1 0 0  Down, Depressed, Hopeless 0 0 0  PHQ - 2 Score 1 0 0  Altered sleeping  1   Tired, decreased energy  2   Change in appetite  0   Feeling bad or failure about yourself   0   Trouble concentrating  0   Moving slowly or fidgety/restless  0   Suicidal  thoughts  0   PHQ-9 Score  3   Difficult doing work/chores  Somewhat difficult     Past Medical History:  Diagnosis Date   Chronic anal fissure    Status post primary low transverse cesarean section 09/24/2019   Past Surgical History:  Procedure Laterality Date   CESAREAN SECTION N/A 09/24/2019   Procedure: CESAREAN SECTION;  Surgeon: Edwinna Areola, DO;  Location: [facility] LD ORS;  Service: Obstetrics;  Laterality: N/A;  request RNFA   lateral internal sphincteromy  02/2014   due to chronic anal fissure   WISDOM TOOTH EXTRACTION     Social History   Socioeconomic History   Marital status: Married    Spouse name: Lina Sayre   Number of children: 1   Years of education: graduate degree   Highest education level: Master's degree (e.g., MA, MS, MEng, MEd, MSW, MBA)  Occupational History   Occupation: Acupuncturist  Tobacco Use   Smoking status: Never   Smokeless tobacco: Never  Vaping Use   Vaping status: Never Used  Substance and Sexual Activity   Alcohol use: Not Currently   Drug use: Never   Sexual activity: Yes    Birth control/protection: None  Other Topics Concern   Not on file  Social History Narrative   Not on file  Social Determinants of Health   Financial Resource Strain: Not on file  Food Insecurity: Not on file  Transportation Needs: Not on file  Physical Activity: Not on file  Stress: Not on file  Social Connections: Not on file  Intimate Partner Violence: Not on file   Family History  Problem Relation Age of Onset   Parkinson's disease Father    Hyperlipidemia Father    Crohn's disease Maternal Grandfather    Multiple sclerosis Maternal Grandfather    Cancer Paternal Grandfather    Current Outpatient Medications on File Prior to Visit  Medication Sig   levonorgestrel (KYLEENA) 19.5 MG IUD 1 each by Intrauterine route.   No current facility-administered medications on file prior to visit.    Review of Systems  Constitutional:   Negative for activity change, appetite change, chills, diaphoresis, fatigue and fever.  HENT:  Negative for congestion and hearing loss.   Eyes:  Negative for visual disturbance.  Respiratory:  Negative for cough, chest tightness, shortness of breath and wheezing.   Cardiovascular:  Negative for chest pain, palpitations and leg swelling.  Gastrointestinal:  Negative for abdominal pain, constipation, diarrhea, nausea and vomiting.  Genitourinary:  Negative for dysuria, frequency and hematuria.  Musculoskeletal:  Negative for arthralgias and neck pain.  Skin:  Negative for rash.  Neurological:  Negative for dizziness, weakness, light-headedness, numbness and headaches.  Hematological:  Negative for adenopathy.  Psychiatric/Behavioral:  Negative for behavioral problems, dysphoric mood and sleep disturbance.    Per HPI unless specifically indicated above      Objective:    BP 128/72   Pulse 63   Ht 5\' 6"  (1.676 m)   Wt 143 lb (64.9 kg)   SpO2 93%   BMI 23.08 kg/m   Wt Readings from Last 3 Encounters:  04/04/23 143 lb (64.9 kg)  03/17/22 142 lb 6.4 oz (64.6 kg)  03/11/21 140 lb 3.2 oz (63.6 kg)    Physical Exam Vitals and nursing note reviewed.  Constitutional:      General: She is not in acute distress.    Appearance: She is well-developed. She is not diaphoretic.     Comments: Well-appearing, comfortable, cooperative  HENT:     Head: Normocephalic and atraumatic.  Eyes:     General:        Right eye: No discharge.        Left eye: No discharge.     Conjunctiva/sclera: Conjunctivae normal.     Pupils: Pupils are equal, round, and reactive to light.  Neck:     Thyroid: No thyromegaly.     Vascular: No carotid bruit.  Cardiovascular:     Rate and Rhythm: Normal rate and regular rhythm.     Pulses: Normal pulses.     Heart sounds: Normal heart sounds. No murmur heard. Pulmonary:     Effort: Pulmonary effort is normal. No respiratory distress.     Breath sounds: Normal  breath sounds. No wheezing or rales.  Abdominal:     General: Bowel sounds are normal. There is no distension.     Palpations: Abdomen is soft. There is no mass.     Tenderness: There is no abdominal tenderness.  Musculoskeletal:        General: No tenderness. Normal range of motion.     Cervical back: Normal range of motion and neck supple.     Right lower leg: No edema.     Left lower leg: No edema.     Comments: Upper /  Lower Extremities: - Normal muscle tone, strength bilateral upper extremities 5/5, lower extremities 5/5  Lymphadenopathy:     Cervical: No cervical adenopathy.  Skin:    General: Skin is warm and dry.     Findings: No erythema or rash.  Neurological:     Mental Status: She is alert and oriented to person, place, and time.     Comments: Distal sensation intact to light touch all extremities  Psychiatric:        Mood and Affect: Mood normal.        Behavior: Behavior normal.        Thought Content: Thought content normal.     Comments: Well groomed, good eye contact, normal speech and thoughts      Results for orders placed or performed in visit on 03/27/23  Thyroid Panel With TSH  Result Value Ref Range   T3 Uptake 34 22 - 35 %   T4, Total 5.3 5.1 - 11.9 mcg/dL   Free Thyroxine Index 1.8 1.4 - 3.8   TSH 3.11 mIU/L  Lipid panel  Result Value Ref Range   Cholesterol 130 <200 mg/dL   HDL 63 > OR = 50 mg/dL   Triglycerides 93 <161 mg/dL   LDL Cholesterol (Calc) 49 mg/dL (calc)   Total CHOL/HDL Ratio 2.1 <5.0 (calc)   Non-HDL Cholesterol (Calc) 67 <096 mg/dL (calc)  CBC with Differential/Platelet  Result Value Ref Range   WBC 7.3 3.8 - 10.8 Thousand/uL   RBC 4.52 3.80 - 5.10 Million/uL   Hemoglobin 13.7 11.7 - 15.5 g/dL   HCT 04.5 40.9 - 81.1 %   MCV 94.0 80.0 - 100.0 fL   MCH 30.3 27.0 - 33.0 pg   MCHC 32.2 32.0 - 36.0 g/dL   RDW 91.4 78.2 - 95.6 %   Platelets 233 140 - 400 Thousand/uL   MPV 10.5 7.5 - 12.5 fL   Neutro Abs 4,438 1,500 - 7,800  cells/uL   Lymphs Abs 1,956 850 - 3,900 cells/uL   Absolute Monocytes 475 200 - 950 cells/uL   Eosinophils Absolute 350 15 - 500 cells/uL   Basophils Absolute 80 0 - 200 cells/uL   Neutrophils Relative % 60.8 %   Total Lymphocyte 26.8 %   Monocytes Relative 6.5 %   Eosinophils Relative 4.8 %   Basophils Relative 1.1 %  COMPLETE METABOLIC PANEL WITH GFR  Result Value Ref Range   Glucose, Bld 85 65 - 99 mg/dL   BUN 10 7 - 25 mg/dL   Creat 2.13 0.86 - 5.78 mg/dL   eGFR 469 > OR = 60 GE/XBM/8.41L2   BUN/Creatinine Ratio SEE NOTE: 6 - 22 (calc)   Sodium 138 135 - 146 mmol/L   Potassium 3.9 3.5 - 5.3 mmol/L   Chloride 104 98 - 110 mmol/L   CO2 28 20 - 32 mmol/L   Calcium 9.2 8.6 - 10.2 mg/dL   Total Protein 7.3 6.1 - 8.1 g/dL   Albumin 4.5 3.6 - 5.1 g/dL   Globulin 2.8 1.9 - 3.7 g/dL (calc)   AG Ratio 1.6 1.0 - 2.5 (calc)   Total Bilirubin 0.8 0.2 - 1.2 mg/dL   Alkaline phosphatase (APISO) 50 31 - 125 U/L   AST 22 10 - 30 U/L   ALT 24 6 - 29 U/L      Assessment & Plan:   Problem List Items Addressed This Visit   None Visit Diagnoses     Annual physical exam    -  Primary       Updated Health Maintenance information Reviewed recent lab results with patient Encouraged improvement to lifestyle with diet and exercise Goal of weight loss  No orders of the defined types were placed in this encounter.     Follow up plan: Return in about 1 year (around 04/03/2024) for 1 year fasting lab only then 1 week later Annual Physical.  ***Future lab  Saralyn Pilar, DO [facility] 04/04/2023, 8:43 AM

## 2023-04-05 ENCOUNTER — Other Ambulatory Visit: Payer: Self-pay | Admitting: Family Medicine

## 2023-04-05 DIAGNOSIS — Z862 Personal history of diseases of the blood and blood-forming organs and certain disorders involving the immune mechanism: Secondary | ICD-10-CM

## 2023-04-05 DIAGNOSIS — Z131 Encounter for screening for diabetes mellitus: Secondary | ICD-10-CM

## 2023-04-05 DIAGNOSIS — E7800 Pure hypercholesterolemia, unspecified: Secondary | ICD-10-CM

## 2023-04-05 DIAGNOSIS — Z0000 Encounter for general adult medical examination without abnormal findings: Secondary | ICD-10-CM

## 2023-05-11 DIAGNOSIS — Z130 Encounter for screening for diseases of the blood and blood-forming organs and certain disorders involving the immune mechanism: Secondary | ICD-10-CM | POA: Diagnosis not present

## 2023-05-11 DIAGNOSIS — Z23 Encounter for immunization: Secondary | ICD-10-CM | POA: Diagnosis not present

## 2023-05-11 DIAGNOSIS — Z124 Encounter for screening for malignant neoplasm of cervix: Secondary | ICD-10-CM | POA: Diagnosis not present

## 2023-05-11 DIAGNOSIS — Z30432 Encounter for removal of intrauterine contraceptive device: Secondary | ICD-10-CM | POA: Diagnosis not present

## 2023-05-11 DIAGNOSIS — Z01419 Encounter for gynecological examination (general) (routine) without abnormal findings: Secondary | ICD-10-CM | POA: Diagnosis not present

## 2023-05-17 LAB — HM PAP SMEAR: HM Pap smear: NEGATIVE

## 2023-06-07 DIAGNOSIS — D2261 Melanocytic nevi of right upper limb, including shoulder: Secondary | ICD-10-CM | POA: Diagnosis not present

## 2023-06-07 DIAGNOSIS — D225 Melanocytic nevi of trunk: Secondary | ICD-10-CM | POA: Diagnosis not present

## 2023-06-07 DIAGNOSIS — D2262 Melanocytic nevi of left upper limb, including shoulder: Secondary | ICD-10-CM | POA: Diagnosis not present

## 2023-06-07 DIAGNOSIS — D2272 Melanocytic nevi of left lower limb, including hip: Secondary | ICD-10-CM | POA: Diagnosis not present

## 2023-08-28 DIAGNOSIS — Z369 Encounter for antenatal screening, unspecified: Secondary | ICD-10-CM | POA: Diagnosis not present

## 2023-08-28 DIAGNOSIS — O26891 Other specified pregnancy related conditions, first trimester: Secondary | ICD-10-CM | POA: Diagnosis not present

## 2023-08-28 DIAGNOSIS — Z3481 Encounter for supervision of other normal pregnancy, first trimester: Secondary | ICD-10-CM | POA: Diagnosis not present

## 2023-08-28 DIAGNOSIS — O34211 Maternal care for low transverse scar from previous cesarean delivery: Secondary | ICD-10-CM | POA: Diagnosis not present

## 2023-08-28 DIAGNOSIS — Z3A09 9 weeks gestation of pregnancy: Secondary | ICD-10-CM | POA: Diagnosis not present

## 2023-08-28 DIAGNOSIS — Z113 Encounter for screening for infections with a predominantly sexual mode of transmission: Secondary | ICD-10-CM | POA: Diagnosis not present

## 2023-08-29 ENCOUNTER — Encounter: Payer: Self-pay | Admitting: Family Medicine

## 2023-08-30 ENCOUNTER — Encounter: Payer: Self-pay | Admitting: Family Medicine

## 2023-09-01 ENCOUNTER — Other Ambulatory Visit: Payer: BC Managed Care – PPO

## 2023-09-01 DIAGNOSIS — E7800 Pure hypercholesterolemia, unspecified: Secondary | ICD-10-CM | POA: Diagnosis not present

## 2023-09-01 DIAGNOSIS — Z862 Personal history of diseases of the blood and blood-forming organs and certain disorders involving the immune mechanism: Secondary | ICD-10-CM

## 2023-09-01 DIAGNOSIS — Z131 Encounter for screening for diabetes mellitus: Secondary | ICD-10-CM

## 2023-09-01 DIAGNOSIS — Z0000 Encounter for general adult medical examination without abnormal findings: Secondary | ICD-10-CM | POA: Diagnosis not present

## 2023-09-02 LAB — COMPLETE METABOLIC PANEL WITH GFR
AG Ratio: 1.5 (calc) (ref 1.0–2.5)
ALT: 33 U/L — ABNORMAL HIGH (ref 6–29)
AST: 22 U/L (ref 10–30)
Albumin: 4 g/dL (ref 3.6–5.1)
Alkaline phosphatase (APISO): 48 U/L (ref 31–125)
BUN: 9 mg/dL (ref 7–25)
CO2: 25 mmol/L (ref 20–32)
Calcium: 9.6 mg/dL (ref 8.6–10.2)
Chloride: 105 mmol/L (ref 98–110)
Creat: 0.68 mg/dL (ref 0.50–0.97)
Globulin: 2.7 g/dL (ref 1.9–3.7)
Glucose, Bld: 79 mg/dL (ref 65–139)
Potassium: 3.6 mmol/L (ref 3.5–5.3)
Sodium: 137 mmol/L (ref 135–146)
Total Bilirubin: 0.5 mg/dL (ref 0.2–1.2)
Total Protein: 6.7 g/dL (ref 6.1–8.1)
eGFR: 119 mL/min/{1.73_m2} (ref >=60)

## 2023-09-02 LAB — CBC WITH DIFFERENTIAL/PLATELET
Absolute Lymphocytes: 1934 {cells}/uL (ref 850–3900)
Absolute Monocytes: 465 {cells}/uL (ref 200–950)
Basophils Absolute: 84 {cells}/uL (ref 0–200)
Basophils Relative: 0.9 %
Eosinophils Absolute: 279 {cells}/uL (ref 15–500)
Eosinophils Relative: 3 %
HCT: 38.5 % (ref 35.0–45.0)
Hemoglobin: 12.7 g/dL (ref 11.7–15.5)
MCH: 31.1 pg (ref 27.0–33.0)
MCHC: 33 g/dL (ref 32.0–36.0)
MCV: 94.1 fL (ref 80.0–100.0)
MPV: 10.8 fL (ref 7.5–12.5)
Monocytes Relative: 5 %
Neutro Abs: 6538 {cells}/uL (ref 1500–7800)
Neutrophils Relative %: 70.3 %
Platelets: 280 10*3/uL (ref 140–400)
RBC: 4.09 10*6/uL (ref 3.80–5.10)
RDW: 12.3 % (ref 11.0–15.0)
Total Lymphocyte: 20.8 %
WBC: 9.3 10*3/uL (ref 3.8–10.8)

## 2023-09-02 LAB — LIPID PANEL
Cholesterol: 165 mg/dL (ref <200)
HDL: 78 mg/dL (ref >=50)
LDL Cholesterol (Calc): 64 mg/dL
Non-HDL Cholesterol (Calc): 87 mg/dL (ref <130)
Total CHOL/HDL Ratio: 2.1 (calc) (ref <5.0)
Triglycerides: 155 mg/dL — ABNORMAL HIGH (ref <150)

## 2023-09-02 LAB — THYROID PANEL WITH TSH
Free Thyroxine Index: 2.1 (ref 1.4–3.8)
T3 Uptake: 25 % (ref 22–35)
T4, Total: 8.3 ug/dL (ref 5.1–11.9)
TSH: 1.67 m[IU]/L

## 2023-09-02 LAB — HEMOGLOBIN A1C
Hgb A1c MFr Bld: 5 %{Hb} (ref <5.7)
Mean Plasma Glucose: 97 mg/dL
eAG (mmol/L): 5.4 mmol/L

## 2023-09-06 ENCOUNTER — Encounter: Payer: Self-pay | Admitting: Family Medicine

## 2023-09-18 ENCOUNTER — Inpatient Hospital Stay (HOSPITAL_COMMUNITY): Payer: BC Managed Care – PPO | Admitting: Anesthesiology

## 2023-09-18 ENCOUNTER — Encounter (HOSPITAL_COMMUNITY)
Admission: AD | Disposition: A | Discharge status: Home / Self Care | Payer: Self-pay | Source: Ambulatory Visit | Attending: Obstetrics and Gynecology

## 2023-09-18 ENCOUNTER — Ambulatory Visit (HOSPITAL_COMMUNITY)
Admission: AD | Admit: 2023-09-18 | Discharge: 2023-09-18 | Disposition: A | Discharge status: Home / Self Care | Payer: BC Managed Care – PPO | Source: Ambulatory Visit | Attending: Obstetrics and Gynecology | Admitting: Obstetrics and Gynecology

## 2023-09-18 ENCOUNTER — Encounter (HOSPITAL_COMMUNITY): Payer: Self-pay | Admitting: Obstetrics and Gynecology

## 2023-09-18 ENCOUNTER — Other Ambulatory Visit: Payer: Self-pay

## 2023-09-18 DIAGNOSIS — O021 Missed abortion: Secondary | ICD-10-CM | POA: Diagnosis not present

## 2023-09-18 DIAGNOSIS — O3680X0 Pregnancy with inconclusive fetal viability, not applicable or unspecified: Secondary | ICD-10-CM | POA: Diagnosis not present

## 2023-09-18 DIAGNOSIS — O3680X1 Pregnancy with inconclusive fetal viability, fetus 1: Secondary | ICD-10-CM | POA: Diagnosis not present

## 2023-09-18 DIAGNOSIS — Z3A12 12 weeks gestation of pregnancy: Secondary | ICD-10-CM | POA: Insufficient documentation

## 2023-09-18 DIAGNOSIS — R109 Unspecified abdominal pain: Secondary | ICD-10-CM | POA: Diagnosis not present

## 2023-09-18 HISTORY — PX: DILATION AND EVACUATION: SHX1459

## 2023-09-18 LAB — CBC
HCT: 40.5 % (ref 36.0–46.0)
Hemoglobin: 13.6 g/dL (ref 12.0–15.0)
MCH: 31.4 pg (ref 26.0–34.0)
MCHC: 33.6 g/dL (ref 30.0–36.0)
MCV: 93.5 fL (ref 80.0–100.0)
Platelets: 252 10*3/uL (ref 150–400)
RBC: 4.33 MIL/uL (ref 3.87–5.11)
RDW: 13.3 % (ref 11.5–15.5)
WBC: 10.3 10*3/uL (ref 4.0–10.5)
nRBC: 0 % (ref 0.0–0.2)

## 2023-09-18 LAB — TYPE AND SCREEN
ABO/RH(D): O POS
Antibody Screen: NEGATIVE

## 2023-09-18 SURGERY — DILATION AND EVACUATION, UTERUS
Anesthesia: General | Site: Cervix

## 2023-09-18 MED ORDER — DEXAMETHASONE SODIUM PHOSPHATE 10 MG/ML IJ SOLN
INTRAMUSCULAR | Status: DC | PRN
  Administered 2023-09-18: 10 mg via INTRAVENOUS

## 2023-09-18 MED ORDER — PROPOFOL 10 MG/ML IV BOLUS
INTRAVENOUS | Status: AC
  Filled 2023-09-18: qty 20

## 2023-09-18 MED ORDER — PROPOFOL 10 MG/ML IV BOLUS
INTRAVENOUS | Status: DC | PRN
  Administered 2023-09-18: 30 mg via INTRAVENOUS
  Administered 2023-09-18: 70 mg via INTRAVENOUS

## 2023-09-18 MED ORDER — POVIDONE-IODINE 10 % EX SWAB
2.0000 | Freq: Once | CUTANEOUS | Status: AC
  Administered 2023-09-18: 2 via TOPICAL

## 2023-09-18 MED ORDER — ACETAMINOPHEN 500 MG PO TABS
ORAL_TABLET | ORAL | Status: AC
  Administered 2023-09-18: 1000 mg via ORAL
  Filled 2023-09-18: qty 2

## 2023-09-18 MED ORDER — ONDANSETRON HCL 4 MG/2ML IJ SOLN
INTRAMUSCULAR | Status: DC | PRN
  Administered 2023-09-18: 4 mg via INTRAVENOUS

## 2023-09-18 MED ORDER — KETOROLAC TROMETHAMINE 30 MG/ML IJ SOLN
INTRAMUSCULAR | Status: DC | PRN
  Administered 2023-09-18: 30 mg via INTRAVENOUS

## 2023-09-18 MED ORDER — OXYCODONE HCL 5 MG PO TABS: 5.0000 mg | ORAL_TABLET | Freq: Once | ORAL | Status: DC | PRN

## 2023-09-18 MED ORDER — LACTATED RINGERS IV SOLN
INTRAVENOUS | Status: DC
Start: 2023-09-18 — End: 2023-09-19

## 2023-09-18 MED ORDER — ONDANSETRON HCL 4 MG/2ML IJ SOLN: 4.0000 mg | Freq: Once | INTRAMUSCULAR | Status: DC | PRN

## 2023-09-18 MED ORDER — TRANEXAMIC ACID-NACL 1000-0.7 MG/100ML-% IV SOLN
1000.0000 mg | Freq: Once | INTRAVENOUS | Status: AC
  Administered 2023-09-18: 1000 mg via INTRAVENOUS
  Filled 2023-09-18: qty 100

## 2023-09-18 MED ORDER — FENTANYL CITRATE (PF) 100 MCG/2ML IJ SOLN
INTRAMUSCULAR | Status: AC
  Filled 2023-09-18: qty 2

## 2023-09-18 MED ORDER — MIDAZOLAM HCL 2 MG/2ML IJ SOLN
INTRAMUSCULAR | Status: AC
  Filled 2023-09-18: qty 2

## 2023-09-18 MED ORDER — MIDAZOLAM HCL 5 MG/5ML IJ SOLN
INTRAMUSCULAR | Status: DC | PRN
  Administered 2023-09-18: 2 mg via INTRAVENOUS

## 2023-09-18 MED ORDER — FENTANYL CITRATE (PF) 100 MCG/2ML IJ SOLN: 25.0000 ug | INTRAMUSCULAR | Status: DC | PRN

## 2023-09-18 MED ORDER — ORAL CARE MOUTH RINSE: 15.0000 mL | Freq: Once | OROMUCOSAL | Status: AC

## 2023-09-18 MED ORDER — FENTANYL CITRATE (PF) 100 MCG/2ML IJ SOLN
INTRAMUSCULAR | Status: DC | PRN
  Administered 2023-09-18 (×2): 50 ug via INTRAVENOUS

## 2023-09-18 MED ORDER — ACETAMINOPHEN 500 MG PO TABS: 1000.0000 mg | ORAL_TABLET | ORAL | Status: DC

## 2023-09-18 MED ORDER — SODIUM CHLORIDE 0.9 % IV SOLN
100.0000 mg | Freq: Once | INTRAVENOUS | Status: AC
  Administered 2023-09-18: 100 mg via INTRAVENOUS
  Filled 2023-09-18: qty 100

## 2023-09-18 MED ORDER — ACETAMINOPHEN 500 MG PO TABS: 1000.0000 mg | ORAL_TABLET | ORAL | Status: AC

## 2023-09-18 MED ORDER — TRAMADOL HCL 50 MG PO TABS: 50.0000 mg | ORAL_TABLET | Freq: Four times a day (QID) | ORAL | 0 refills | Status: DC | PRN

## 2023-09-18 MED ORDER — OXYCODONE HCL 5 MG/5ML PO SOLN: 5.0000 mg | Freq: Once | ORAL | Status: DC | PRN

## 2023-09-18 MED ORDER — STERILE WATER FOR IRRIGATION IR SOLN
Status: DC | PRN
  Administered 2023-09-18: 1

## 2023-09-18 MED ORDER — LACTATED RINGERS IV SOLN: INTRAVENOUS | Status: DC

## 2023-09-18 MED ORDER — CHLORHEXIDINE GLUCONATE 0.12 % MT SOLN
15.0000 mL | Freq: Once | OROMUCOSAL | Status: AC
Start: 2023-09-18 — End: 2023-09-18
  Administered 2023-09-18: 15 mL via OROMUCOSAL
  Filled 2023-09-18: qty 15

## 2023-09-18 MED ORDER — PROPOFOL 500 MG/50ML IV EMUL
INTRAVENOUS | Status: DC | PRN
  Administered 2023-09-18: 150 ug/kg/min via INTRAVENOUS

## 2023-09-18 SURGICAL SUPPLY — 15 items
CATH ROBINSON RED A/P 16FR (CATHETERS) ×1 IMPLANT
FILTER UTR ASPR ASSEMBLY (MISCELLANEOUS) ×1 IMPLANT
GLOVE BIO SURGEON STRL SZ 6.5 (GLOVE) ×1 IMPLANT
GLOVE BIOGEL PI IND STRL 7.0 (GLOVE) ×1 IMPLANT
GOWN STRL REUS W/ TWL LRG LVL3 (GOWN DISPOSABLE) ×2 IMPLANT
HOSE CONNECTING 18IN BERKELEY (TUBING) ×1 IMPLANT
KIT BERKELEY 1ST TRI 3/8 NO TR (MISCELLANEOUS) ×1 IMPLANT
KIT BERKELEY 1ST TRIMESTER 3/8 (MISCELLANEOUS) ×1 IMPLANT
PACK VAGINAL MINOR WOMEN LF (CUSTOM PROCEDURE TRAY) ×1 IMPLANT
PAD OB MATERNITY 4.3X12.25 (PERSONAL CARE ITEMS) ×1 IMPLANT
SET BERKELEY SUCTION TUBING (SUCTIONS) ×1 IMPLANT
TOWEL GREEN STERILE FF (TOWEL DISPOSABLE) ×2 IMPLANT
UNDERPAD 30X36 HEAVY ABSORB (UNDERPADS AND DIAPERS) ×1 IMPLANT
VACURETTE 10 RIGID CVD (CANNULA) IMPLANT
VACURETTE 8 RIGID CVD (CANNULA) IMPLANT

## 2023-09-18 NOTE — Discharge Instructions (Addendum)
Call office with any concerns [phone]

## 2023-09-18 NOTE — Progress Notes (Signed)
Called patient with pre-op instructions. Patient stated she ate a reese cup and chick fil a hashbrowns at 0840. Anesthesia made aware and stated surgery time delayed until 1640. OR made aware.

## 2023-09-18 NOTE — Transfer of Care (Signed)
Immediate Anesthesia Transfer of Care Note  Patient: Virginia Haas  Procedure(s) Performed: DILATATION AND EVACUATION  Patient Location: PACU  Anesthesia Type:MAC  Level of Consciousness: awake, alert , and oriented  Airway & Oxygen Therapy: Patient Spontanous Breathing  Post-op Assessment: Report given to RN and Post -op Vital signs reviewed and stable  Post vital signs: Reviewed and stable  Last Vitals:  Vitals Value Taken Time  BP 115/65 09/18/23 1754  Temp    Pulse 86 09/18/23 1759  Resp 9 09/18/23 1759  SpO2 97 % 09/18/23 1759  Vitals shown include unfiled device data.  Last Pain:  Vitals:   09/18/23 1218  TempSrc: Oral         Complications: No notable events documented.

## 2023-09-18 NOTE — Interval H&P Note (Signed)
History and Physical Interval Note:  Pt seen and examined. She is very anxious. No change otherwise since H/P done. Pt had to wait till 1700 for surgery due to having had a full breakfast at 9am today Reviewed procedure and expectations Consent confirmed  To OR when ready  09/18/2023 4:55 PM  Virginia Haas  has presented today for surgery, with the diagnosis of ABDOMINAL PAIN.  The various methods of treatment have been discussed with the patient and family. After consideration of risks, benefits and other options for treatment, the patient has consented to  Procedure(s): DILATATION AND EVACUATION (N/A) as a surgical intervention.  The patient's history has been reviewed, patient examined, no change in status, stable for surgery.  I have reviewed the patient's chart and labs.  Questions were answered to the patient's satisfaction.     Cathrine Muster

## 2023-09-18 NOTE — Anesthesia Preprocedure Evaluation (Addendum)
Anesthesia Evaluation  Patient identified by MRN, date of birth, ID band Patient awake    Reviewed: Allergy & Precautions, NPO status , Patient's Chart, lab work & pertinent test results  Airway Mallampati: II       Dental no notable dental hx. (+) Teeth Intact, Dental Advisory Given   Pulmonary neg pulmonary ROS   Pulmonary exam normal breath sounds clear to auscultation       Cardiovascular negative cardio ROS Normal cardiovascular exam Rhythm:Regular Rate:Normal     Neuro/Psych negative neurological ROS     GI/Hepatic Neg liver ROS,,,Chronic abdominal pain Hx/o anal fissure s/p lateral sphincterotomy   Endo/Other  negative endocrine ROS    Renal/GU negative Renal ROS  negative genitourinary   Musculoskeletal   Abdominal   Peds  Hematology negative hematology ROS (+)   Anesthesia Other Findings   Reproductive/Obstetrics Hx/o C/Section                             Anesthesia Physical Anesthesia Plan  ASA: 1  Anesthesia Plan: MAC   Post-op Pain Management: Toradol IV (intra-op)*   Induction: Intravenous  PONV Risk Score and Plan: 4 or greater and Treatment may vary due to age or medical condition, Midazolam, Ondansetron, TIVA and Dexamethasone  Airway Management Planned: Nasal Cannula, Simple Face Mask and Natural Airway  Additional Equipment: None  Intra-op Plan:   Post-operative Plan:   Informed Consent: I have reviewed the patients History and Physical, chart, labs and discussed the procedure including the risks, benefits and alternatives for the proposed anesthesia with the patient or authorized representative who has indicated his/her understanding and acceptance.     Dental advisory given  Plan Discussed with: CRNA and Anesthesiologist  Anesthesia Plan Comments:        Anesthesia Quick Evaluation

## 2023-09-18 NOTE — Op Note (Signed)
Operative Note    Preoperative Diagnosis Non viable pregnancy at 12 weeks per dates   Postoperative Diagnosis: Same    Procedure: Suction dilation and evacuation of products of conception    Surgeon: Mindi Slicker, C DO   Anesthesia: General   Fluids: LR EBL: 50ml UOP: 50ml Meds: TXA, doxycycline, toradol   Findings: Uterus sounded to 11.5cm    Specimen: Products of conception    Procedure Note  Consent was confirmed from  patient prior to arriving in OR  Patient was taken to the operating room where general anesthesia was administered without difficulty. She was then prepped and draped in the normal sterile fashion after she had been placed in the dorsal lithotomy position. An appropriate time out was performed. Bladder was emptied   An exam under anesthesia noted an anteverted uterus.  A speculum was then placed within the vagina and the anterior lip of the cervix identified and grasped with a single toothed tenaculum. Uterus was then sounded to 11.5cm.  The Pratt dilators were utilized to dilate the cervix up to approximately 27. A size 8 rigid cannula was then inserted into the uterine cavity and suction begun; it was later changed to a size 10.   There was moderate amount of products evacuated. A sharp curettage was then performed with a gritty texture noted in all quadrants. This was then followed with another round of suction until scant amount of products were returned.   At this point, all instruments were removed; the tenaculum site was noted to be hemostatic.  There was scant to no bleeding from cervical os. The speculum was thus removed.  Pt was cleaned and awakened.   She was taken to the recovery room in stable condition.  Counts were correct per nursing staff during and at the end of the procedure x 2  A sample of the products of conception were collected for anora testing.Blood was drawn from patient by anesthesia at start of procedure.

## 2023-09-18 NOTE — H&P (Addendum)
Virginia Haas is an 33 y.o. G23P1001 female presenting for scheduled suction dilation and evacuation of products of conception due to missed pregnancy, suspect molar pregnancy , first trimester  Pt was seen in [facility]U and diagnosed with a non viable pregnancy. US showed : Single non viable IUP with increased vascularity and cystic lesions surrounding GS. Consistent with MAB Plan for IPAS in office discussed but on review, given US findings, concern for molar pregnancy and risks of bleeding discussed with pt by Dr Reina Fuse. Pt consented to outpt procedure instead in hospital with me ( Dr Mindi Slicker) today  Hx c/s x 1 -- Pt desires anora testing  Pertinent Gynecological History: Menses:  none Bleeding: none Contraception: none DES exposure: denies Blood transfusions: none Sexually transmitted diseases: no past history Previous GYN Procedures:  none   Last mammogram:  n/a  Date:    Last pap: normal Date: 04/2023 OB History: G2, P1001   Menstrual History: Menarche age: 67 No LMP recorded. (Menstrual status: IUD).    Past Medical History:  Diagnosis Date   Chronic anal fissure    Status post primary low transverse cesarean section 09/24/2019    Past Surgical History:  Procedure Laterality Date   CESAREAN SECTION N/A 09/24/2019   Procedure: CESAREAN SECTION;  Surgeon: Edwinna Areola, DO;  Location: [facility] LD ORS;  Service: Obstetrics;  Laterality: N/A;  request RNFA   lateral internal sphincteromy  02/2014   due to chronic anal fissure   WISDOM TOOTH EXTRACTION      Family History  Problem Relation Age of Onset   Parkinson's disease Father    Hyperlipidemia Father    Crohn's disease Maternal Grandfather    Multiple sclerosis Maternal Grandfather    Cancer Paternal Grandfather     Social History:  reports that she has never smoked. She has never used smokeless tobacco. She reports that she does not currently use alcohol. She reports that she does not use drugs.  Allergies: No Known  Allergies  No medications prior to admission.    Review of Systems  Constitutional:  Negative for activity change.  Eyes:  Negative for photophobia and visual disturbance.  Respiratory:  Negative for chest tightness and shortness of breath.   Gastrointestinal:  Negative for abdominal pain.  Genitourinary:  Negative for pelvic pain, vaginal bleeding and vaginal discharge.  Musculoskeletal:  Negative for back pain.  Neurological:  Negative for light-headedness and headaches.  Psychiatric/Behavioral:  The patient is nervous/anxious.     unknown if currently breastfeeding. Physical Exam Constitutional:      Appearance: Normal appearance. She is normal weight.  Cardiovascular:     Rate and Rhythm: Normal rate.     Pulses: Normal pulses.  Pulmonary:     Effort: Pulmonary effort is normal.  Abdominal:     Palpations: Abdomen is soft.  Musculoskeletal:        General: Normal range of motion.     Cervical back: Normal range of motion.  Skin:    General: Skin is warm and dry.     Capillary Refill: Capillary refill takes 2 to 3 seconds.  Neurological:     General: No focal deficit present.     Mental Status: She is alert and oriented to person, place, and time. Mental status is at baseline.  Psychiatric:        Mood and Affect: Mood normal.        Behavior: Behavior normal.        Thought Content: Thought content normal.  Judgment: Judgment normal.     No results found for this or any previous visit (from the past 24 hours).  No results found.  Assessment/Plan: 32yo G2P1001 with [name] diagnosed nonviable fetus x 2 here for suction dilation and evacuation of products of conception - Admit  - ERAS protocol  - ANora to be done  - Verify consent prior to OR   Cathrine Muster 09/18/2023, 11:15 AM

## 2023-09-19 ENCOUNTER — Encounter (HOSPITAL_COMMUNITY): Payer: Self-pay | Admitting: Obstetrics and Gynecology

## 2023-09-20 LAB — SURGICAL PATHOLOGY

## 2023-09-20 NOTE — Anesthesia Postprocedure Evaluation (Signed)
Anesthesia Post Note  Patient: Virginia Haas  Procedure(s) Performed: DILATATION AND EVACUATION (Cervix)     Patient location during evaluation: PACU Anesthesia Type: MAC Level of consciousness: awake and alert Pain management: pain level controlled Vital Signs Assessment: post-procedure vital signs reviewed and stable Respiratory status: spontaneous breathing, nonlabored ventilation, respiratory function stable and patient connected to nasal cannula oxygen Cardiovascular status: stable and blood pressure returned to baseline Postop Assessment: no apparent nausea or vomiting Anesthetic complications: no  No notable events documented.  Last Vitals:  Vitals:   09/18/23 1845 09/18/23 1900  BP: 117/62 103/67  Pulse: 66 66  Resp: 16 17  Temp:  (!) 36.3 C  SpO2: 95% 100%    Last Pain:  Vitals:   09/18/23 1845  TempSrc:   PainSc: 0-No pain                 Nate Common

## 2023-09-27 DIAGNOSIS — O021 Missed abortion: Secondary | ICD-10-CM | POA: Diagnosis not present

## 2023-09-27 LAB — ANORA MISCARRIAGE TEST - FRESH

## 2023-10-03 DIAGNOSIS — Z8759 Personal history of other complications of pregnancy, childbirth and the puerperium: Secondary | ICD-10-CM | POA: Diagnosis not present

## 2023-10-10 ENCOUNTER — Telehealth: Payer: Self-pay

## 2023-10-10 DIAGNOSIS — Z8759 Personal history of other complications of pregnancy, childbirth and the puerperium: Secondary | ICD-10-CM | POA: Diagnosis not present

## 2023-10-10 NOTE — Telephone Encounter (Signed)
Left message for patient to call office to reschedule due to inclement weather expected.  Will also send patient a MyChart message to call the office to reschedule

## 2023-10-11 ENCOUNTER — Ambulatory Visit: Payer: BC Managed Care – PPO

## 2023-10-13 DIAGNOSIS — F4323 Adjustment disorder with mixed anxiety and depressed mood: Secondary | ICD-10-CM | POA: Diagnosis not present

## 2023-10-17 DIAGNOSIS — Z8759 Personal history of other complications of pregnancy, childbirth and the puerperium: Secondary | ICD-10-CM | POA: Diagnosis not present

## 2023-10-18 ENCOUNTER — Ambulatory Visit: Payer: Self-pay | Admitting: Maternal & Fetal Medicine

## 2023-10-18 ENCOUNTER — Other Ambulatory Visit: Payer: Self-pay

## 2023-10-18 ENCOUNTER — Ambulatory Visit: Payer: BC Managed Care – PPO | Attending: Maternal & Fetal Medicine

## 2023-10-18 DIAGNOSIS — O021 Missed abortion: Secondary | ICD-10-CM | POA: Diagnosis not present

## 2023-10-18 DIAGNOSIS — O285 Abnormal chromosomal and genetic finding on antenatal screening of mother: Secondary | ICD-10-CM

## 2023-10-18 DIAGNOSIS — Z8759 Personal history of other complications of pregnancy, childbirth and the puerperium: Secondary | ICD-10-CM | POA: Diagnosis not present

## 2023-10-18 NOTE — Progress Notes (Cosign Needed Addendum)
 Referring Provider: Dr. Mindi Slicker, [facility] Length of Consultation:  60 minutes   [name] was referred to [facility] for genetic counseling to discuss the results of the Anora chromosomal microarray performed on her recent pregnancy loss which revealed triploidy. We also reviewed screening and testing options available now as well as in any future pregnancy due to this history. This note summarizes the information we discussed with the patient (in person) and her partner, Virginia Haas, who was present by phone.   Anora results: The chromosomal microarray report from Anora testing on the products of conception sample showed the following results:    It is estimated that 45% of first trimester miscarriages are found to have a chromosome abnormality.  This may include whole extra or missing chromosomes as well as complex chromosome rearrangements involving deletions or duplications of multiple chromosomes.  It is estimated that triploidy is present in 11% of chromosomally abnormal pregnancy losses.  Triploidy is caused by a complete extra set of chromosomes; instead of having forty-six chromosomes, one set of twenty-three from each parent, a person with triploidy has sixty-nine chromosomes. Triploidy is typically the result of two sperm fertilizing a single egg but can be caused by a maternal nondisjunction event or a sperm with two copies of the chromosome material.  We discussed that triploidy is considered a lethal condition and typically results in a miscarriage, although in some cases not until the second or third trimester.  There are rare reports of livebirth with triploidy with babies passing away at less than six months of age.  Triploidy also can increase the chance of maternal complications such as preeclampsia. When the triploidy is paternal in origin, it is associated with partial molar pregnancy in which monitoring for development of gestational trophoblastic neoplasia is needed.  This follow up is being done by her OB with serial hCG levels.  If there are additional concerns, referral to a GYN oncologist could be considered.  Triploidy is usually a sporadic event and typically is not thought to significantly increase the chance for triploidy in a subsequent pregnancy. We quoted up to a 1% chance for a chromosome condition in a future pregnancy, though this is likely an overestimation. Prenatal diagnosis could be considered in a future pregnancy if desired for reassurance.  As a means of screening or testing in a future pregnancy, we discussed the following:   Targeted ultrasound uses high frequency sound waves to create an image of the developing fetus. An ultrasound is often recommended as a routine means of evaluating the pregnancy. It is also used to screen for fetal anatomy problems (for example, a heart defect) that might be suggestive of a chromosomal or other abnormality. First trimester ultrasound can be used for dating and viability as well as to evaluate for early anatomical concerns.  Second trimester detailed anatomy ultrasound is more helpful to detect structural malformations or growth concerns.  Cell free DNA testing from maternal blood may be used to determine whether a baby is at high risk for more common chromosome conditions. This test utilizes a maternal blood sample and DNA sequencing technology to isolate circulating cell free fetal DNA from maternal plasma. The fetal DNA can then be analyzed for DNA sequences that are derived from the three most common chromosomes involved in aneuploidy, chromosomes 13, 18, and 21.  If the overall amount of DNA is greater than the expected level for any of these chromosomes, aneuploidy is suspected.  The detection rate for Down syndrome  and trisomy 18 is >99% and the detection rate for trisomy 13 is >91%. While we do not consider it a replacement for invasive testing and karyotype analysis, a negative result from this testing  would be reassuring, though not a guarantee of a normal chromosome complement for the baby.  An abnormal result is certainly suggestive of an abnormal chromosome complement, though we would still recommend CVS or amniocentesis to confirm any findings from this testing. This testing can also assess for the sex chromosomes and can detect approximately 96% of sex chromosome aneuploidies and determine fetal gender with >99% confidence as well as evaluate for certain chromosome microdeletions/duplications.  We would suggest cell free testing through [facility] Panorama test, as this is the only NIPS that includes testing for triploidy due to the use of SNP based technology.     The chorionic villus sampling procedure is available for first trimester chromosome analysis. This involves the withdrawal of a small amount of chorionic villi (tissue from the developing placenta). Risk of pregnancy loss is estimated to be less than 1 in 200. There is approximately a 1% (1 in 100) chance that the CVS chromosome results will be unclear. Chorionic villi cannot be tested for neural tube defects.   Amniocentesis involves the removal of a small amount of amniotic fluid from the sac surrounding the fetus with the use of a thin needle inserted through the maternal abdomen and uterus. Ultrasound guidance is used throughout the procedure. Fetal cells from amniotic fluid are directly evaluated and > 99.5% of chromosome problems and > 98% of open neural tube defects can be detected. This procedure is generally performed after the 15th week of pregnancy. The main risks to this procedure include complications leading to miscarriage in less than 1 in 500 cases.    Routine screening: Per the ACOG Committee Opinion 691, all women who are considering a pregnancy or are currently pregnant should be offered carrier screening for, at minimum, Cystic Fibrosis (CF), Spinal Muscular Atrophy (SMA), and Hemoglobinopathies. Dawt reported that she  underwent an expanded carrier risk panel through [facility] in her last pregnancy.  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  She also stated that her husband, Virginia Haas, had testing through [facility] that did not report a change in that gene.  If he is not a carrier, then their children are not at risk, however, I do not have a copy of his results.  She was not concerned about this result and felt her questions were all answered.   Family history and pregnancy history: We obtained a detailed family history and pregnancy history.  This was the second pregnancy for this couple.  They have a healthy 61 year old daughter. In the family history, Adalis and her brother both have pectus excavatum. This is a condition in which the breast bone is caved inward, giving a sunken appearance to the chest.  It happens most commonly in boys and in severe cases may affect the function of the heart or lungs, requiring surgery.  Pectus excavatum is most often present as an isolated difference, but can be part of several genetic syndromes including Marfan syndrome, Noonan syndrome, Ehlers-Danlos syndrome, Turner syndrome and Osteogensis imperfecta. In the absence of a syndrome, the specific cause is not known.  Though some genetic factors are possible, the recurrence is expected to be low. Shanda Bumps also reported that her father was diagnosed with  Parkinson disease at around 32 years old.  He has no other family history of the condition. Parkinson more often occurs sporadically, though cases with multiple affected family members to that present at an early age are more likely to have strong genetic factors including dominant inheritance in some families. Of interest, Mimi also reported a maternal uncle who had a pregnancy loss with triploidy, though we do not expect that to alter the recurrence risk. The remainder of the family history was reported  to be unremarkable for birth defects, intellectual delays, recurrent pregnancy loss or known chromosome abnormalities.    Plan of Care: No testing on the couple was recommended at this time.  She spoke briefly with Dr. Grace Bushy, who encouraged her to continue to follow up on the hCG values as directed by her OB. The couple indicated that they would reach out in any upcoming pregnancy at which time we can plan for the desired testing at the appropriate gestational age. Sherronda was clear that she would prefer Panorama with microdeletions/duplications at [redacted] weeks gestation if possible.  We wish them all the best as they grieve the loss of their pregnancy and being to look ahead.  We may be reached at [phone] or [phone].   Cherly Anderson, MS, CGC  In total, 80 minutes were spent on the date of the encounter in service to the patient including preparation, face-to-face consultation, documentation and care coordination.

## 2023-10-24 DIAGNOSIS — Z8759 Personal history of other complications of pregnancy, childbirth and the puerperium: Secondary | ICD-10-CM | POA: Diagnosis not present

## 2023-10-30 DIAGNOSIS — R102 Pelvic and perineal pain: Secondary | ICD-10-CM | POA: Diagnosis not present

## 2023-10-30 DIAGNOSIS — Z8759 Personal history of other complications of pregnancy, childbirth and the puerperium: Secondary | ICD-10-CM | POA: Diagnosis not present

## 2023-10-31 DIAGNOSIS — F411 Generalized anxiety disorder: Secondary | ICD-10-CM | POA: Diagnosis not present

## 2023-11-06 DIAGNOSIS — Z8759 Personal history of other complications of pregnancy, childbirth and the puerperium: Secondary | ICD-10-CM | POA: Diagnosis not present

## 2023-11-09 DIAGNOSIS — F411 Generalized anxiety disorder: Secondary | ICD-10-CM | POA: Diagnosis not present

## 2023-11-13 DIAGNOSIS — O021 Missed abortion: Secondary | ICD-10-CM | POA: Diagnosis not present

## 2023-11-20 DIAGNOSIS — O021 Missed abortion: Secondary | ICD-10-CM | POA: Diagnosis not present

## 2023-11-21 DIAGNOSIS — F411 Generalized anxiety disorder: Secondary | ICD-10-CM | POA: Diagnosis not present

## 2023-12-06 DIAGNOSIS — O021 Missed abortion: Secondary | ICD-10-CM | POA: Diagnosis not present

## 2023-12-14 DIAGNOSIS — F411 Generalized anxiety disorder: Secondary | ICD-10-CM | POA: Diagnosis not present

## 2023-12-18 DIAGNOSIS — O021 Missed abortion: Secondary | ICD-10-CM | POA: Diagnosis not present

## 2023-12-21 DIAGNOSIS — O021 Missed abortion: Secondary | ICD-10-CM | POA: Diagnosis not present

## 2023-12-21 DIAGNOSIS — F411 Generalized anxiety disorder: Secondary | ICD-10-CM | POA: Diagnosis not present

## 2023-12-27 DIAGNOSIS — F411 Generalized anxiety disorder: Secondary | ICD-10-CM | POA: Diagnosis not present

## 2023-12-28 DIAGNOSIS — N760 Acute vaginitis: Secondary | ICD-10-CM | POA: Diagnosis not present

## 2023-12-28 DIAGNOSIS — N858 Other specified noninflammatory disorders of uterus: Secondary | ICD-10-CM | POA: Diagnosis not present

## 2023-12-28 DIAGNOSIS — N898 Other specified noninflammatory disorders of vagina: Secondary | ICD-10-CM | POA: Diagnosis not present

## 2024-01-03 DIAGNOSIS — F411 Generalized anxiety disorder: Secondary | ICD-10-CM | POA: Diagnosis not present

## 2024-01-10 DIAGNOSIS — F411 Generalized anxiety disorder: Secondary | ICD-10-CM | POA: Diagnosis not present

## 2024-01-16 DIAGNOSIS — Z8759 Personal history of other complications of pregnancy, childbirth and the puerperium: Secondary | ICD-10-CM | POA: Diagnosis not present

## 2024-01-16 DIAGNOSIS — O0889 Other complications following an ectopic and molar pregnancy: Secondary | ICD-10-CM | POA: Diagnosis not present

## 2024-01-16 DIAGNOSIS — N858 Other specified noninflammatory disorders of uterus: Secondary | ICD-10-CM | POA: Diagnosis not present

## 2024-01-16 DIAGNOSIS — N898 Other specified noninflammatory disorders of vagina: Secondary | ICD-10-CM | POA: Diagnosis not present

## 2024-01-18 DIAGNOSIS — F411 Generalized anxiety disorder: Secondary | ICD-10-CM | POA: Diagnosis not present

## 2024-02-28 DIAGNOSIS — Z3201 Encounter for pregnancy test, result positive: Secondary | ICD-10-CM | POA: Diagnosis not present

## 2024-03-06 DIAGNOSIS — Z3A01 Less than 8 weeks gestation of pregnancy: Secondary | ICD-10-CM | POA: Diagnosis not present

## 2024-03-06 DIAGNOSIS — O3680X1 Pregnancy with inconclusive fetal viability, fetus 1: Secondary | ICD-10-CM | POA: Diagnosis not present

## 2024-03-06 DIAGNOSIS — Z3201 Encounter for pregnancy test, result positive: Secondary | ICD-10-CM | POA: Diagnosis not present

## 2024-03-07 ENCOUNTER — Telehealth: Payer: Self-pay

## 2024-03-13 DIAGNOSIS — Z3A01 Less than 8 weeks gestation of pregnancy: Secondary | ICD-10-CM | POA: Diagnosis not present

## 2024-03-13 DIAGNOSIS — Z8759 Personal history of other complications of pregnancy, childbirth and the puerperium: Secondary | ICD-10-CM | POA: Diagnosis not present

## 2024-03-13 DIAGNOSIS — O3680X1 Pregnancy with inconclusive fetal viability, fetus 1: Secondary | ICD-10-CM | POA: Diagnosis not present

## 2024-04-01 DIAGNOSIS — O26891 Other specified pregnancy related conditions, first trimester: Secondary | ICD-10-CM | POA: Diagnosis not present

## 2024-04-01 DIAGNOSIS — Z369 Encounter for antenatal screening, unspecified: Secondary | ICD-10-CM | POA: Diagnosis not present

## 2024-04-01 DIAGNOSIS — Z202 Contact with and (suspected) exposure to infections with a predominantly sexual mode of transmission: Secondary | ICD-10-CM | POA: Diagnosis not present

## 2024-04-01 DIAGNOSIS — Z1331 Encounter for screening for depression: Secondary | ICD-10-CM | POA: Diagnosis not present

## 2024-04-01 DIAGNOSIS — Z3481 Encounter for supervision of other normal pregnancy, first trimester: Secondary | ICD-10-CM | POA: Diagnosis not present

## 2024-04-01 DIAGNOSIS — N898 Other specified noninflammatory disorders of vagina: Secondary | ICD-10-CM | POA: Diagnosis not present

## 2024-04-01 DIAGNOSIS — Z3A09 9 weeks gestation of pregnancy: Secondary | ICD-10-CM | POA: Diagnosis not present

## 2024-04-01 DIAGNOSIS — Z113 Encounter for screening for infections with a predominantly sexual mode of transmission: Secondary | ICD-10-CM | POA: Diagnosis not present

## 2024-04-02 ENCOUNTER — Other Ambulatory Visit: Payer: Self-pay | Admitting: Family Medicine

## 2024-04-02 DIAGNOSIS — Z862 Personal history of diseases of the blood and blood-forming organs and certain disorders involving the immune mechanism: Secondary | ICD-10-CM

## 2024-04-02 DIAGNOSIS — Z131 Encounter for screening for diabetes mellitus: Secondary | ICD-10-CM

## 2024-04-02 DIAGNOSIS — E7800 Pure hypercholesterolemia, unspecified: Secondary | ICD-10-CM

## 2024-04-02 DIAGNOSIS — Z0000 Encounter for general adult medical examination without abnormal findings: Secondary | ICD-10-CM

## 2024-04-03 ENCOUNTER — Other Ambulatory Visit: Payer: Self-pay

## 2024-04-05 ENCOUNTER — Other Ambulatory Visit

## 2024-04-08 ENCOUNTER — Other Ambulatory Visit

## 2024-04-08 DIAGNOSIS — E7800 Pure hypercholesterolemia, unspecified: Secondary | ICD-10-CM | POA: Diagnosis not present

## 2024-04-08 DIAGNOSIS — Z862 Personal history of diseases of the blood and blood-forming organs and certain disorders involving the immune mechanism: Secondary | ICD-10-CM | POA: Diagnosis not present

## 2024-04-08 DIAGNOSIS — Z0000 Encounter for general adult medical examination without abnormal findings: Secondary | ICD-10-CM | POA: Diagnosis not present

## 2024-04-08 DIAGNOSIS — Z131 Encounter for screening for diabetes mellitus: Secondary | ICD-10-CM | POA: Diagnosis not present

## 2024-04-09 LAB — COMPREHENSIVE METABOLIC PANEL WITH GFR
AG Ratio: 1.6 (calc) (ref 1.0–2.5)
ALT: 22 U/L (ref 6–29)
AST: 19 U/L (ref 10–30)
Albumin: 4.2 g/dL (ref 3.6–5.1)
Alkaline phosphatase (APISO): 47 U/L (ref 31–125)
BUN: 7 mg/dL (ref 7–25)
CO2: 23 mmol/L (ref 20–32)
Calcium: 9.2 mg/dL (ref 8.6–10.2)
Chloride: 104 mmol/L (ref 98–110)
Creat: 0.55 mg/dL (ref 0.50–0.97)
Globulin: 2.6 g/dL (ref 1.9–3.7)
Glucose, Bld: 84 mg/dL (ref 65–99)
Potassium: 4 mmol/L (ref 3.5–5.3)
Sodium: 135 mmol/L (ref 135–146)
Total Bilirubin: 0.6 mg/dL (ref 0.2–1.2)
Total Protein: 6.8 g/dL (ref 6.1–8.1)
eGFR: 125 mL/min/1.73m2 (ref >=60)

## 2024-04-09 LAB — HEMOGLOBIN A1C
Hgb A1c MFr Bld: 5 % (ref <5.7)
Mean Plasma Glucose: 97 mg/dL
eAG (mmol/L): 5.4 mmol/L

## 2024-04-09 LAB — CBC WITH DIFFERENTIAL/PLATELET
Absolute Lymphocytes: 2102 {cells}/uL (ref 850–3900)
Absolute Monocytes: 461 {cells}/uL (ref 200–950)
Basophils Absolute: 79 {cells}/uL (ref 0–200)
Basophils Relative: 1.1 %
Eosinophils Absolute: 259 {cells}/uL (ref 15–500)
Eosinophils Relative: 3.6 %
HCT: 40.3 % (ref 35.0–45.0)
Hemoglobin: 12.7 g/dL (ref 11.7–15.5)
MCH: 30.6 pg (ref 27.0–33.0)
MCHC: 31.5 g/dL — ABNORMAL LOW (ref 32.0–36.0)
MCV: 97.1 fL (ref 80.0–100.0)
MPV: 9.8 fL (ref 7.5–12.5)
Monocytes Relative: 6.4 %
Neutro Abs: 4298 {cells}/uL (ref 1500–7800)
Neutrophils Relative %: 59.7 %
Platelets: 243 Thousand/uL (ref 140–400)
RBC: 4.15 Million/uL (ref 3.80–5.10)
RDW: 12.6 % (ref 11.0–15.0)
Total Lymphocyte: 29.2 %
WBC: 7.2 Thousand/uL (ref 3.8–10.8)

## 2024-04-09 LAB — LIPID PANEL
Cholesterol: 171 mg/dL (ref <200)
HDL: 85 mg/dL (ref >=50)
LDL Cholesterol (Calc): 62 mg/dL
Non-HDL Cholesterol (Calc): 86 mg/dL (ref <130)
Total CHOL/HDL Ratio: 2 (calc) (ref <5.0)
Triglycerides: 161 mg/dL — ABNORMAL HIGH (ref <150)

## 2024-04-09 LAB — THYROID PANEL WITH TSH
Free Thyroxine Index: 2.1 (ref 1.4–3.8)
T4, Total: 8.6 ug/dL (ref 5.1–11.9)
TSH: 2.69 m[IU]/L
TSH: 24 m[IU]/L (ref 22–35)

## 2024-04-10 ENCOUNTER — Other Ambulatory Visit: Payer: Self-pay | Admitting: Family Medicine

## 2024-04-10 ENCOUNTER — Encounter: Payer: Self-pay | Admitting: Family Medicine

## 2024-04-10 ENCOUNTER — Ambulatory Visit (INDEPENDENT_AMBULATORY_CARE_PROVIDER_SITE_OTHER): Payer: Self-pay | Admitting: Family Medicine

## 2024-04-10 VITALS — BP 120/72 | HR 101 | Ht 66.0 in | Wt 152.5 lb

## 2024-04-10 DIAGNOSIS — Z131 Encounter for screening for diabetes mellitus: Secondary | ICD-10-CM

## 2024-04-10 DIAGNOSIS — Z0000 Encounter for general adult medical examination without abnormal findings: Secondary | ICD-10-CM

## 2024-04-10 DIAGNOSIS — E7800 Pure hypercholesterolemia, unspecified: Secondary | ICD-10-CM

## 2024-04-10 DIAGNOSIS — Z862 Personal history of diseases of the blood and blood-forming organs and certain disorders involving the immune mechanism: Secondary | ICD-10-CM

## 2024-04-10 NOTE — Progress Notes (Signed)
 Subjective:    Patient ID: Virginia Haas, female    DOB: [birth year], 33 y.o.   MRN: [id number]  Virginia Haas is a 33 y.o. female presenting on 04/10/2024 for Annual Exam   HPI  Discussed the use of AI scribe software for clinical note transcription with the patient, who gave verbal consent to proceed.  History of Present Illness   Virginia Haas is a 33 year old female who presents for an annual physical exam.  Pregnancy, current. Estimated GA 10.5 weeks Followed by [facility] [place] - Currently 10.[redacted] weeks pregnant, due in early March 2026 - Pregnancy follows a partial molar pregnancy in January 2025 requiring extensive follow-up - For the previous molar pregnancy, Initial HCG levels post-molar pregnancy were 200,000, decreased over time without chemotherapy - Postoperative vascular mass resolved naturally over 17 weeks - Became pregnant again three weeks after being cleared from prior follow-up  Current pregnancy updates - NIPT testing negative for triploidy; fetus is female  Lipid metabolism and glucose regulation - History of elevated cholesterol during pregnancy, previously occurred with her daughter - Current LDL 62 - A1c is 5.0  Dermatologic concerns - History of dysplastic mole - Considering dermatological follow-up for an inflamed spot on her arm - Plans to follow up with dermatology for skin concerns  - Slightly elevated blood pressure today, attributed to stress but still very normal - No current issues with fluid retention or circulation  Immunization status - Received flu shot in the past year - Considering pausing COVID vaccine during pregnancy  Gynecologic and cancer screening - Up to date on cervical cancer screening  Medication tolerance - Takes prenatal vitamin with iron at night to avoid nausea     TSH normal range. She has history of elevated TSH during pregnancy   Chronic Anal Fissure S/p Lateral sphincterotomy in 02/2014.  Had C-section done due to this prior surgery     Health Maintenance:   Decline COVID   Due for Flu Shot when available.   Updated Pap Smear per [facility], 05/17/23 negative, repeat 3 years       04/04/2023    8:41 AM 03/17/2022    8:09 AM 03/11/2021    8:48 AM  Depression screen PHQ 2/9  Decreased Interest 1 0 0  Down, Depressed, Hopeless 0 0 0  PHQ - 2 Score 1 0 0  Altered sleeping  1   Tired, decreased energy  2   Change in appetite  0   Feeling bad or failure about yourself   0   Trouble concentrating  0   Moving slowly or fidgety/restless  0   Suicidal thoughts  0   PHQ-9 Score  3   Difficult doing work/chores  Somewhat difficult        04/04/2023    8:41 AM 03/17/2022    8:10 AM  GAD 7 : Generalized Anxiety Score  Nervous, Anxious, on Edge 1 1  Control/stop worrying 1 1  Worry too much - different things 2 2  Trouble relaxing 2 2  Restless 0 0  Easily annoyed or irritable 1 2  Afraid - awful might happen 1 2  Total GAD 7 Score 8 10  Anxiety Difficulty  Somewhat difficult     Past Medical History:  Diagnosis Date   Chronic anal fissure    Status post primary low transverse cesarean section 09/24/2019   Past Surgical History:  Procedure Laterality Date   CESAREAN SECTION N/A  09/24/2019   Procedure: CESAREAN SECTION;  Surgeon: Delana Ted Morrison, DO;  Location: [facility] LD ORS;  Service: Obstetrics;  Laterality: N/A;  request RNFA   DILATION AND EVACUATION N/A 09/18/2023   Procedure: DILATATION AND EVACUATION;  Surgeon: Delana Ted Morrison, DO;  Location: [facility] OR;  Service: Gynecology;  Laterality: N/A;   lateral internal sphincteromy  02/2014   due to chronic anal fissure   WISDOM TOOTH EXTRACTION     Social History   Socioeconomic History   Marital status: Married    Spouse name: Virginia Haas   Number of children: 1   Years of education: graduate degree   Highest education level: Master's degree (e.g., MA, MS, MEng, MEd, MSW,  MBA)  Occupational History   Occupation: Acupuncturist  Tobacco Use   Smoking status: Never   Smokeless tobacco: Never  Vaping Use   Vaping status: Never Used  Substance and Sexual Activity   Alcohol use: Not Currently   Drug use: Never   Sexual activity: Yes    Birth control/protection: None  Other Topics Concern   Not on file  Social History Narrative   Not on file   Social Drivers of Health   Financial Resource Strain: Not on file  Food Insecurity: Not on file  Transportation Needs: Not on file  Physical Activity: Not on file  Stress: Not on file  Social Connections: Not on file  Intimate Partner Violence: Not on file   Family History  Problem Relation Age of Onset   Parkinson's disease Father    Hyperlipidemia Father    Crohn's disease Maternal Grandfather    Multiple sclerosis Maternal Grandfather    Cancer Paternal Grandfather    Current Outpatient Medications on File Prior to Visit  Medication Sig   acetaminophen  (TYLENOL ) 500 MG tablet Take 500-1,000 mg by mouth every 6 (six) hours as needed for moderate pain (pain score 4-6).   Doxylamine Succinate, Sleep, (UNISOM PO) Take 1 tablet by mouth daily as needed (Nausea). Unisom B6   Prenatal Vit-Fe Fumarate-FA (PRENATAL MULTIVITAMIN) TABS tablet Take 1 tablet by mouth daily at 12 noon.   Probiotic Product (PROBIOTIC DAILY PO)    No current facility-administered medications on file prior to visit.    Review of Systems  Constitutional:  Negative for activity change, appetite change, chills, diaphoresis, fatigue and fever.  HENT:  Negative for congestion and hearing loss.   Eyes:  Negative for visual disturbance.  Respiratory:  Negative for cough, chest tightness, shortness of breath and wheezing.   Cardiovascular:  Negative for chest pain, palpitations and leg swelling.  Gastrointestinal:  Negative for abdominal pain, constipation, diarrhea, nausea and vomiting.  Genitourinary:  Negative for dysuria,  frequency and hematuria.  Musculoskeletal:  Negative for arthralgias and neck pain.  Skin:  Negative for rash.  Neurological:  Negative for dizziness, weakness, light-headedness, numbness and headaches.  Hematological:  Negative for adenopathy.  Psychiatric/Behavioral:  Negative for behavioral problems, dysphoric mood and sleep disturbance.    Per HPI unless specifically indicated above     Objective:    BP 120/72 (BP Location: Left Arm, Patient Position: Sitting, Cuff Size: Normal)   Pulse (!) 101   Ht 5' 6 (1.676 m)   Wt 152 lb 8 oz (69.2 kg)   SpO2 98%   BMI 24.61 kg/m   Wt Readings from Last 3 Encounters:  04/10/24 152 lb 8 oz (69.2 kg)  09/18/23 154 lb (69.9 kg)  04/04/23 143 lb (64.9 kg)  Physical Exam Vitals and nursing note reviewed.  Constitutional:      General: She is not in acute distress.    Appearance: She is well-developed. She is not diaphoretic.     Comments: Well-appearing, comfortable, cooperative  HENT:     Head: Normocephalic and atraumatic.  Eyes:     General:        Right eye: No discharge.        Left eye: No discharge.     Conjunctiva/sclera: Conjunctivae normal.     Pupils: Pupils are equal, round, and reactive to light.  Neck:     Thyroid : No thyromegaly.  Cardiovascular:     Rate and Rhythm: Normal rate and regular rhythm.     Pulses: Normal pulses.     Heart sounds: Normal heart sounds. No murmur heard. Pulmonary:     Effort: Pulmonary effort is normal. No respiratory distress.     Breath sounds: Normal breath sounds. No wheezing or rales.  Abdominal:     General: Bowel sounds are normal. There is no distension.     Palpations: Abdomen is soft. There is no mass.     Tenderness: There is no abdominal tenderness.  Musculoskeletal:        General: No tenderness. Normal range of motion.     Cervical back: Normal range of motion and neck supple.     Comments: Upper / Lower Extremities: - Normal muscle tone, strength bilateral upper  extremities 5/5, lower extremities 5/5  Lymphadenopathy:     Cervical: No cervical adenopathy.  Skin:    General: Skin is warm and dry.     Findings: No erythema or rash.  Neurological:     Mental Status: She is alert and oriented to person, place, and time.     Comments: Distal sensation intact to light touch all extremities  Psychiatric:        Mood and Affect: Mood normal.        Behavior: Behavior normal.        Thought Content: Thought content normal.     Comments: Well groomed, good eye contact, normal speech and thoughts     Results for orders placed or performed in visit on 04/02/24  Lipid panel   Collection Time: 04/08/24 12:00 AM  Result Value Ref Range   Cholesterol 171 <200 mg/dL   HDL 85 > OR = 50 mg/dL   Triglycerides 838 (H) <150 mg/dL   LDL Cholesterol (Calc) 62 mg/dL (calc)   Total CHOL/HDL Ratio 2.0 <5.0 (calc)   Non-HDL Cholesterol (Calc) 86 <869 mg/dL (calc)  Hemoglobin J8r   Collection Time: 04/08/24 12:00 AM  Result Value Ref Range   Hgb A1c MFr Bld 5.0 <5.7 %   Mean Plasma Glucose 97 mg/dL   eAG (mmol/L) 5.4 mmol/L  CBC with Differential/Platelet   Collection Time: 04/08/24 12:00 AM  Result Value Ref Range   WBC 7.2 3.8 - 10.8 Thousand/uL   RBC 4.15 3.80 - 5.10 Million/uL   Hemoglobin 12.7 11.7 - 15.5 g/dL   HCT 59.6 64.9 - 54.9 %   MCV 97.1 80.0 - 100.0 fL   MCH 30.6 27.0 - 33.0 pg   MCHC 31.5 (L) 32.0 - 36.0 g/dL   RDW 87.3 88.9 - 84.9 %   Platelets 243 140 - 400 Thousand/uL   MPV 9.8 7.5 - 12.5 fL   Neutro Abs 4,298 1,500 - 7,800 cells/uL   Absolute Lymphocytes 2,102 850 - 3,900 cells/uL   Absolute Monocytes 461 200 -  950 cells/uL   Eosinophils Absolute 259 15 - 500 cells/uL   Basophils Absolute 79 0 - 200 cells/uL   Neutrophils Relative % 59.7 %   Total Lymphocyte 29.2 %   Monocytes Relative 6.4 %   Eosinophils Relative 3.6 %   Basophils Relative 1.1 %  Comprehensive metabolic panel with GFR   Collection Time: 04/08/24 12:00 AM   Result Value Ref Range   Glucose, Bld 84 65 - 99 mg/dL   BUN 7 7 - 25 mg/dL   Creat 9.44 9.49 - 9.02 mg/dL   eGFR 874 > OR = 60 fO/fpw/8.26f7   BUN/Creatinine Ratio SEE NOTE: 6 - 22 (calc)   Sodium 135 135 - 146 mmol/L   Potassium 4.0 3.5 - 5.3 mmol/L   Chloride 104 98 - 110 mmol/L   CO2 23 20 - 32 mmol/L   Calcium  9.2 8.6 - 10.2 mg/dL   Total Protein 6.8 6.1 - 8.1 g/dL   Albumin 4.2 3.6 - 5.1 g/dL   Globulin 2.6 1.9 - 3.7 g/dL (calc)   AG Ratio 1.6 1.0 - 2.5 (calc)   Total Bilirubin 0.6 0.2 - 1.2 mg/dL   Alkaline phosphatase (APISO) 47 31 - 125 U/L   AST 19 10 - 30 U/L   ALT 22 6 - 29 U/L  Thyroid  Panel With TSH   Collection Time: 04/08/24 12:00 AM  Result Value Ref Range   T3 Uptake 24 22 - 35 %   T4, Total 8.6 5.1 - 11.9 mcg/dL   Free Thyroxine Index 2.1 1.4 - 3.8   TSH 2.69 mIU/L      Assessment & Plan:   Problem List Items Addressed This Visit   None Visit Diagnoses       Annual physical exam    -  Primary        Updated Health Maintenance information Reviewed recent lab results with patient Encouraged improvement to lifestyle with diet and exercise Goal of weight loss  Currently Pregnant, estimated gestational age 48.5 weeks Followed and managed by GYN in [place] [facility] Significant recent history of partial molar pregnancy 08/2023. Current pregnancy normal, NIPT normal, no chromosomal abnormalities identified. Anxiety due to previous complications addressed. - Continue routine prenatal care and monitoring per OBGYN provider - Encourage flu vaccination this season  History Partial molar pregnancy Previous partial molar pregnancy resolved without chemotherapy. Current pregnancy monitored closely.  Adult Wellness Visit Blood pressure slightly elevated but normal. Cholesterol slightly elevated, LDL stable. A1c normal.  No anemia but very slight MCHC abnormal result, not concerning, likely some blood volume changes w pregnancy Liver  and kidney functions normal. - Schedule next annual wellness visit for August next year. - Monitor blood pressure and cholesterol levels post-pregnancy.  Hyperlipidemia Cholesterol slightly elevated due to pregnancy, LDL stable. - Continue monitoring lipid levels post-pregnancy. - No treatment required  Benign skin nevi and lesions under dermatologic surveillance History of dysplastic mole and benign lesions. Inflamed spot on arm previously assessed as benign. - Schedule dermatology appointment for evaluation. - Consider removal of bothersome lesion if necessary.         No orders of the defined types were placed in this encounter.   No orders of the defined types were placed in this encounter.    Follow up plan: Return for 1 year fasting lab > 1 week later Annual Physical.  Future labs 04/09/2025   Marsa Officer, DO [facility] 04/10/2024, 8:15 AM

## 2024-04-10 NOTE — Patient Instructions (Addendum)
 Thank you for coming to the office today.  Keep in touch if you need anything during pregnancy.  Flu Shot for prevention during this season  COVID optional, would advise against.  DUE for FASTING BLOOD WORK (no food or drink after midnight before the lab appointment, only water  or coffee without cream/sugar on the morning of)  SCHEDULE Lab Only visit in the morning at the clinic for lab draw in 1 YEAR  - Make sure Lab Only appointment is at about 1 week before your next appointment, so that results will be available  For Lab Results, once available within 2-3 days of blood draw, you can can log in to MyChart online to view your results and a brief explanation. Also, we can discuss results at next follow-up visit.    Please schedule a Follow-up Appointment to: Return for 1 year fasting lab > 1 week later Annual Physical.  If you have any other questions or concerns, please feel free to call the office or send a message through MyChart. You may also schedule an earlier appointment if necessary.  Additionally, you may be receiving a survey about your experience at our office within a few days to 1 week by e-mail or mail. We value your feedback.  Marsa Officer, DO [facility] [place], [place]

## 2024-04-11 DIAGNOSIS — D2361 Other benign neoplasm of skin of right upper limb, including shoulder: Secondary | ICD-10-CM | POA: Diagnosis not present

## 2024-04-24 DIAGNOSIS — Z3A12 12 weeks gestation of pregnancy: Secondary | ICD-10-CM | POA: Diagnosis not present

## 2024-04-24 DIAGNOSIS — Z3481 Encounter for supervision of other normal pregnancy, first trimester: Secondary | ICD-10-CM | POA: Diagnosis not present

## 2024-04-24 DIAGNOSIS — N898 Other specified noninflammatory disorders of vagina: Secondary | ICD-10-CM | POA: Diagnosis not present

## 2024-04-24 DIAGNOSIS — Z3682 Encounter for antenatal screening for nuchal translucency: Secondary | ICD-10-CM | POA: Diagnosis not present

## 2024-04-24 DIAGNOSIS — Z3490 Encounter for supervision of normal pregnancy, unspecified, unspecified trimester: Secondary | ICD-10-CM | POA: Diagnosis not present

## 2024-05-17 DIAGNOSIS — N898 Other specified noninflammatory disorders of vagina: Secondary | ICD-10-CM | POA: Diagnosis not present

## 2024-05-17 DIAGNOSIS — N760 Acute vaginitis: Secondary | ICD-10-CM | POA: Diagnosis not present

## 2024-06-03 DIAGNOSIS — Z23 Encounter for immunization: Secondary | ICD-10-CM | POA: Diagnosis not present

## 2024-07-02 DIAGNOSIS — Z2239 Carrier of other specified bacterial diseases: Secondary | ICD-10-CM | POA: Diagnosis not present

## 2024-08-01 DIAGNOSIS — D485 Neoplasm of uncertain behavior of skin: Secondary | ICD-10-CM | POA: Diagnosis not present

## 2024-08-01 DIAGNOSIS — D2361 Other benign neoplasm of skin of right upper limb, including shoulder: Secondary | ICD-10-CM | POA: Diagnosis not present

## 2024-08-01 DIAGNOSIS — R58 Hemorrhage, not elsewhere classified: Secondary | ICD-10-CM | POA: Diagnosis not present

## 2024-08-05 DIAGNOSIS — Z3A27 27 weeks gestation of pregnancy: Secondary | ICD-10-CM | POA: Diagnosis not present

## 2024-08-05 DIAGNOSIS — O3503X0 Maternal care for (suspected) central nervous system malformation or damage in fetus, choroid plexus cysts, not applicable or unspecified: Secondary | ICD-10-CM | POA: Diagnosis not present

## 2024-08-05 DIAGNOSIS — L299 Pruritus, unspecified: Secondary | ICD-10-CM | POA: Diagnosis not present

## 2024-08-05 DIAGNOSIS — Z3482 Encounter for supervision of other normal pregnancy, second trimester: Secondary | ICD-10-CM | POA: Diagnosis not present

## 2024-08-05 DIAGNOSIS — Z3689 Encounter for other specified antenatal screening: Secondary | ICD-10-CM | POA: Diagnosis not present

## 2024-08-05 DIAGNOSIS — Z23 Encounter for immunization: Secondary | ICD-10-CM | POA: Diagnosis not present

## 2024-10-28 ENCOUNTER — Inpatient Hospital Stay (HOSPITAL_COMMUNITY): Admit: 2024-10-28 | Admitting: Obstetrics and Gynecology

## 2024-10-28 ENCOUNTER — Encounter (HOSPITAL_COMMUNITY): Payer: Self-pay

## 2024-10-28 SURGERY — Surgical Case
Anesthesia: Spinal

## 2025-04-09 ENCOUNTER — Other Ambulatory Visit

## 2025-04-23 ENCOUNTER — Encounter: Admitting: Family Medicine
